# Patient Record
Sex: Male | Born: 1975
Health system: Southern US, Community
[De-identification: ages and names within clinical notes are randomized; demographics above are authoritative.]

## PROBLEM LIST (undated history)

## (undated) DIAGNOSIS — I10 Essential (primary) hypertension: Secondary | ICD-10-CM

## (undated) DIAGNOSIS — K219 Gastro-esophageal reflux disease without esophagitis: Secondary | ICD-10-CM

## (undated) DIAGNOSIS — F410 Panic disorder [episodic paroxysmal anxiety] without agoraphobia: Secondary | ICD-10-CM

## (undated) DIAGNOSIS — J302 Other seasonal allergic rhinitis: Secondary | ICD-10-CM

## (undated) DIAGNOSIS — I480 Paroxysmal atrial fibrillation: Principal | ICD-10-CM

## (undated) HISTORY — DX: Other seasonal allergic rhinitis: J30.2

## (undated) HISTORY — PX: NO PAST SURGERIES: SHX2092

## (undated) HISTORY — DX: Essential (primary) hypertension: I10

## (undated) HISTORY — DX: Gastro-esophageal reflux disease without esophagitis: K21.9

## (undated) HISTORY — DX: Paroxysmal atrial fibrillation: I48.0

---

## 2016-01-02 ENCOUNTER — Encounter (HOSPITAL_COMMUNITY): Payer: Self-pay

## 2016-01-02 ENCOUNTER — Emergency Department (HOSPITAL_COMMUNITY)
Admission: EM | Admit: 2016-01-02 | Discharge: 2016-01-02 | Disposition: A | Discharge status: Home / Self Care | Payer: 59 | Attending: Emergency Medicine | Admitting: Emergency Medicine

## 2016-01-02 DIAGNOSIS — F1290 Cannabis use, unspecified, uncomplicated: Secondary | ICD-10-CM | POA: Insufficient documentation

## 2016-01-02 DIAGNOSIS — R002 Palpitations: Secondary | ICD-10-CM | POA: Diagnosis present

## 2016-01-02 DIAGNOSIS — F17200 Nicotine dependence, unspecified, uncomplicated: Secondary | ICD-10-CM | POA: Insufficient documentation

## 2016-01-02 DIAGNOSIS — I480 Paroxysmal atrial fibrillation: Secondary | ICD-10-CM | POA: Diagnosis not present

## 2016-01-02 HISTORY — DX: Panic disorder (episodic paroxysmal anxiety): F41.0

## 2016-01-02 LAB — BASIC METABOLIC PANEL
Anion gap: 5 (ref 5–15)
BUN: 15 mg/dL (ref 6–20)
CALCIUM: 9.3 mg/dL (ref 8.9–10.3)
CO2: 22 mmol/L (ref 22–32)
CREATININE: 0.84 mg/dL (ref 0.61–1.24)
Chloride: 114 mmol/L — ABNORMAL HIGH (ref 101–111)
Glucose, Bld: 108 mg/dL — ABNORMAL HIGH (ref 65–99)
Potassium: 3.9 mmol/L (ref 3.5–5.1)
SODIUM: 141 mmol/L (ref 135–145)

## 2016-01-02 LAB — CBC
HCT: 46.3 % (ref 39.0–52.0)
HEMOGLOBIN: 15.3 g/dL (ref 13.0–17.0)
MCH: 28.3 pg (ref 26.0–34.0)
MCHC: 33 g/dL (ref 30.0–36.0)
MCV: 85.7 fL (ref 78.0–100.0)
PLATELETS: 194 10*3/uL (ref 150–400)
RBC: 5.4 MIL/uL (ref 4.22–5.81)
RDW: 13.2 % (ref 11.5–15.5)
WBC: 7.3 10*3/uL (ref 4.0–10.5)

## 2016-01-02 LAB — TROPONIN I

## 2016-01-02 LAB — TSH: TSH: 0.772 u[IU]/mL (ref 0.350–4.500)

## 2016-01-02 MED ORDER — DILTIAZEM HCL 25 MG/5ML IV SOLN
10.0000 mg | Freq: Once | INTRAVENOUS | Status: AC
  Administered 2016-01-02: 10 mg via INTRAVENOUS
  Filled 2016-01-02: qty 5

## 2016-01-02 MED ORDER — FLECAINIDE ACETATE 100 MG PO TABS
300.0000 mg | ORAL_TABLET | Freq: Once | ORAL | Status: AC
  Administered 2016-01-02: 300 mg via ORAL
  Filled 2016-01-02: qty 3

## 2016-01-02 MED ORDER — ASPIRIN EC 81 MG PO TBEC
81.0000 mg | DELAYED_RELEASE_TABLET | Freq: Every day | ORAL | Status: DC
Start: 2016-01-02 — End: 2016-01-05

## 2016-01-02 MED ORDER — METOPROLOL SUCCINATE ER 25 MG PO TB24
25.0000 mg | ORAL_TABLET | Freq: Every day | ORAL | Status: DC
Start: 2016-01-02 — End: 2016-01-05

## 2016-01-02 MED ORDER — SODIUM CHLORIDE 0.9 % IV SOLN
1000.0000 mL | Freq: Once | INTRAVENOUS | Status: AC
  Administered 2016-01-02: 1000 mL via INTRAVENOUS

## 2016-01-02 MED ORDER — LORAZEPAM 2 MG/ML IJ SOLN
1.0000 mg | Freq: Once | INTRAMUSCULAR | Status: AC
  Administered 2016-01-02: 1 mg via INTRAVENOUS
  Filled 2016-01-02: qty 1

## 2016-01-02 MED ORDER — SODIUM CHLORIDE 0.9 % IV SOLN
1000.0000 mL | INTRAVENOUS | Status: DC
  Administered 2016-01-02: 1000 mL via INTRAVENOUS

## 2016-01-02 MED ORDER — DILTIAZEM HCL 25 MG/5ML IV SOLN
20.0000 mg | Freq: Once | INTRAVENOUS | Status: AC
  Administered 2016-01-02: 20 mg via INTRAVENOUS
  Filled 2016-01-02: qty 5

## 2016-01-02 NOTE — ED Notes (Signed)
Pt given discharge instructions verbalized understanding of need to follow up, reasons to return to the ED and medications to take at home. VSS. IV removed intact, site clean and dry. Pt denied further questions or pain. Pt denied pain. Pt able to change clothes and ambulate to exit without difficulty.

## 2016-01-02 NOTE — Discharge Instructions (Signed)

## 2016-01-02 NOTE — ED Notes (Signed)
He is more calm.  Monitor shows persistent a fib. With average rate at this time of 70/min.

## 2016-01-02 NOTE — ED Notes (Signed)
As I write this he has just taken his p.o. Med and is quite comfortably resting in the presence of his wife.  He remains in controlled a-fib. With average heart rate of 80.

## 2016-01-02 NOTE — ED Notes (Signed)
Patient reports that he has been having tachycardia and states his wife felt his pulse and it felt irregular. Patient states he does have panic attacks, but this feels a little different.

## 2016-01-02 NOTE — ED Notes (Signed)
Wife phone [phone]

## 2016-01-02 NOTE — ED Notes (Signed)
He is sleeping soundly and is breathing normally. I see that he converted to nsr at 1508 hours. I print a strip and give it to Dr. Venora Maples.

## 2016-01-02 NOTE — ED Provider Notes (Signed)
CSN: [id number]     Arrival date & time 01/02/16  [id number] History   First MD Initiated Contact with Patient 01/02/16 0930     Chief Complaint  Patient presents with  . Tachycardia      HPI Patient presents to emergency department with complaints of palpitations which began this morning at approximately 3 AM.  He has a history of intermittent palpitations but they've usually always resolved on their own.  His been present for the past several years.  No known history of atrial fibrillation.  His wife works in the Physicist, medical and states that she felt his pulse and it felt irregular and thus he presented to the ER with concerns for possible A. fib.  He denies chest pain or shortness of breath.  No recent fevers or chills.  No illness.  Denies abdominal pain.  Reports an abnormal fluttering sensation occasionally in his chest since 3 AM.  When he went to bed last night he felt normal.   Past Medical History  Diagnosis Date  . Panic attacks    History reviewed. No pertinent past surgical history. Family History  Problem Relation Age of Onset  . Crohn's disease Mother   . Hypertension Mother    Social History  Substance Use Topics  . Smoking status: Never Smoker   . Smokeless tobacco: Current User  . Alcohol Use: Yes     Comment: 2-3 beers daily    Review of Systems  All other systems reviewed and are negative.     Allergies  Review of patient's allergies indicates no known allergies.  Home Medications   Prior to Admission medications   Not on File   BP 172/97 mmHg  Pulse 119  Temp(Src) 98.1 F (36.7 C) (Oral)  Resp 18  Ht 6\' 1"  (1.854 m)  Wt 208 lb (94.348 kg)  BMI 27.45 kg/m2  SpO2 100% Physical Exam  Constitutional: He is oriented to person, place, and time. He appears well-developed and well-nourished.  HENT:  Head: Normocephalic and atraumatic.  Eyes: EOM are normal.  Neck: Normal range of motion.  Cardiovascular: Intact distal pulses.   Irregularly  irregular and tachycardic rate  Pulmonary/Chest: Effort normal and breath sounds normal. No respiratory distress.  Abdominal: Soft. He exhibits no distension. There is no tenderness.  Musculoskeletal: Normal range of motion.  Neurological: He is alert and oriented to person, place, and time.  Skin: Skin is warm and dry.  Psychiatric: He has a normal mood and affect. Judgment normal.  Nursing note and vitals reviewed.   ED Course  Procedures (including critical care time) Labs Review Labs Reviewed  BASIC METABOLIC PANEL - Abnormal; Notable for the following:    Chloride 114 (*)    Glucose, Bld 108 (*)    All other components within normal limits  CBC  TROPONIN I  TSH    Imaging Review No results found. I have personally reviewed and evaluated these images and lab results as part of my medical decision-making.   EKG Interpretation #1 Date/Time:  Saturday January 02 2016 09:25:18 EDT Ventricular Rate:  105 PR Interval:    QRS Duration: 92 QT Interval:  319 QTC Calculation: 406 R Axis:   102 Text Interpretation:  Atrial fibrillation Right axis deviation Borderline  low voltage, extremity leads No old tracing to compare Confirmed by Laconda Basich   MD, Luismario Coston ([id number]) on 01/02/2016 4:19:10 PM    ECG interpretation #2  Date: 01/02/2016 (15:38)  Rate: 72  Rhythm: normal sinus rhythm  QRS Axis: normal  Intervals: normal  ST/T Wave abnormalities: normal  Conduction Disutrbances: none  Narrative Interpretation:   Old EKG Reviewed: no longer in afib       MDM   Final diagnoses:  Paroxysmal atrial fibrillation ([place])    Presented with onset of A. fib.  This likely began 3 AM.  Per history it sounds as though this is been intermittent issue of the past several years.  No prior history of A. fib.  Patient did not cover with IV Cardizem and therefore was given flecainide.  Patient converted at 3:06 PM.   Patient be placed on 81 mg aspirin as well as 25 mg of Toprol XL.  Cardiology  follow-up in the ambulatory A. fib clinic.  CHADS-VASc = 0    Jola Schmidt, MD 01/02/16 [phone]

## 2016-01-05 ENCOUNTER — Ambulatory Visit (HOSPITAL_COMMUNITY)
Admission: RE | Admit: 2016-01-05 | Discharge: 2016-01-05 | Disposition: A | Discharge status: Home / Self Care | Payer: 59 | Source: Ambulatory Visit | Attending: Nurse Practitioner | Admitting: Nurse Practitioner

## 2016-01-05 ENCOUNTER — Encounter (HOSPITAL_COMMUNITY): Payer: Self-pay | Admitting: Nurse Practitioner

## 2016-01-05 VITALS — BP 140/98 | HR 75

## 2016-01-05 DIAGNOSIS — F17220 Nicotine dependence, chewing tobacco, uncomplicated: Secondary | ICD-10-CM | POA: Diagnosis not present

## 2016-01-05 DIAGNOSIS — Z7982 Long term (current) use of aspirin: Secondary | ICD-10-CM | POA: Insufficient documentation

## 2016-01-05 DIAGNOSIS — I480 Paroxysmal atrial fibrillation: Secondary | ICD-10-CM | POA: Insufficient documentation

## 2016-01-05 DIAGNOSIS — Z8249 Family history of ischemic heart disease and other diseases of the circulatory system: Secondary | ICD-10-CM | POA: Insufficient documentation

## 2016-01-05 DIAGNOSIS — I4891 Unspecified atrial fibrillation: Secondary | ICD-10-CM | POA: Diagnosis present

## 2016-01-05 MED ORDER — RIVAROXABAN 20 MG PO TABS: 20.0000 mg | ORAL_TABLET | Freq: Every day | ORAL | 0 refills | Status: DC

## 2016-01-05 MED ORDER — DILTIAZEM HCL 30 MG PO TABS: ORAL_TABLET | ORAL | 1 refills | Status: DC

## 2016-01-05 MED FILL — XARELTO 20 MG TABLET: 20 | 30 days supply | Qty: 30 | Fill #0

## 2016-01-05 MED FILL — dilTIAZem HCL 30 MG TABS: 30 | 7 days supply | Qty: 45 | Fill #0

## 2016-01-05 NOTE — Patient Instructions (Signed)
Your physician has recommended you make the following change in your medication:  1)Xarelto 20mg  -- take 1 tablet by mouth with supper - take for 30 days 2)Cardizem 30mg  -- take 1 tablet every 4 hours AS NEEDED for heart rate >100 as long as blood pressure >100.  3)Stop Aspirin 4)Stop Metoprolol

## 2016-01-05 NOTE — Progress Notes (Signed)
Patient ID: Dean Hughes, male   DOB: 02/25/1976, 40 y.o.   MRN: [id number]     Primary Care Physician: No primary care provider on file. Referring Physician: ER f/u   Dean Hughes is a 40 y.o. male with  visit to ER with palpitations that started at 3am after he awoke suddenly to the sound of his young daughter falling out of bed and found to be in new onset afib. He was given IV Cardizem, which did not convert pt and he was then given flecainide which did convert pt. He was given RX for asa and Toprol  xl 25 mg  daily. He reports that he has had several small episodes of irregular heart beat over the years but nothing as severe compared to the episode that brought him to the ER.  He does drink 2-3 beers a night and more on the weekend. Per wife, who is an Therapist, sports in the neuro unit at [employer], he does not snore. He has stopped all alcohol and has cut out his caffeine. He also uses smokeless tobacco. 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 He is not obese. He feels that he does not feel as well taking daily BB, feels sluggish.  Today, he denies symptoms of palpitations, chest pain, shortness of breath, orthopnea, PND, lower extremity edema, dizziness, presyncope, syncope, or neurologic sequela. The patient is tolerating medications without difficulties and is otherwise without complaint today.   Past Medical History:  Diagnosis Date  . Panic attacks    No past surgical history on file.  Current Outpatient Prescriptions  Medication Sig Dispense Refill  . diltiazem (CARDIZEM) 30 MG tablet Cardizem 30mg  -- take 1 tablet every 4 hours AS NEEDED for heart rate >100 as long as blood pressure >100. 45 tablet 1  . rivaroxaban (XARELTO) 20 MG TABS tablet Take 1 tablet (20 mg total) by mouth daily with supper. 30 tablet 0   No current facility-administered medications for this encounter.     No Known Allergies  Social  History   Social History  . Marital status: Married    Spouse name: N/A  . Number of children: N/A  . Years of education: N/A   Occupational History  . Not on file.   Social History Main Topics  . Smoking status: Never Smoker  . Smokeless tobacco: Current User  . Alcohol use Yes     Comment: 2-3 beers daily  . Drug use:     Types: Marijuana     Comment: 2-3 times a week  . Sexual activity: Yes   Other Topics Concern  . Not on file   Social History Narrative  . No narrative on file    Family History  Problem Relation Age of Onset  . Crohn's disease Mother   . Hypertension Mother     ROS- All systems are reviewed and negative except as per the HPI above  Physical Exam: Vitals:   01/05/16 1413  BP: (!) 140/98  Pulse: 75    GEN- The patient is well appearing, alert and oriented x 3 today.   Head- normocephalic, atraumatic Eyes-  Sclera clear, conjunctiva pink Ears- hearing intact Oropharynx- clear Neck- supple, no JVP Lymph- no cervical lymphadenopathy Lungs- Clear to ausculation bilaterally, normal work of breathing Heart- Regular rate and rhythm, no murmurs, rubs or gallops, PMI not laterally displaced GI- soft, NT, ND, + BS Extremities- no clubbing,  cyanosis, or edema MS- no significant deformity or atrophy Skin- no rash or lesion Psych- euthymic mood, full affect Neuro- strength and sensation are intact  EKG-NSR at 75 bpm, pr int 172 ms, qrs int 82 ms, QTC 419 ms Epic records reviewed  Assessment and Plan: 1. PAF Now in SR but does not feel well on daily BB, will stop daily BB RX for cardizem 30 mg as need for PAF Per cardioversion protocol, regardless of chadsvasc score, and reviewed with Dr. Rayann Heman, any pt who is converted either electrically or chemically, should be on anticoagulant x 30 days. Pt does not have bleeding issues Will start xarelto 20 mg x 30 days Stop asa Bleeding precautions discussed Echo scheduled  2. Lifestyle risk  factors Stop smokeless tobacco Pt asked to reduce alcohol intake to no more than 2 a week Can have some caffeine, but watch overall intake and avoid other stimulants  F/u with Dr. Radford Pax as scheduled 8/8   Butch Penny C. Rosanne Wohlfarth, [facility] [address] [place], [place] [address] [phone]

## 2016-01-11 ENCOUNTER — Ambulatory Visit (HOSPITAL_COMMUNITY)
Admission: RE | Admit: 2016-01-11 | Discharge: 2016-01-11 | Disposition: A | Discharge status: Home / Self Care | Payer: 59 | Source: Ambulatory Visit | Attending: Nurse Practitioner | Admitting: Nurse Practitioner

## 2016-01-11 DIAGNOSIS — I480 Paroxysmal atrial fibrillation: Secondary | ICD-10-CM

## 2016-01-11 DIAGNOSIS — I4891 Unspecified atrial fibrillation: Secondary | ICD-10-CM | POA: Diagnosis present

## 2016-01-11 NOTE — Progress Notes (Signed)
  Echocardiogram 2D Echocardiogram has been performed.  Diamond Nickel 01/11/2016, 9:55 AM

## 2016-01-12 ENCOUNTER — Encounter (INDEPENDENT_AMBULATORY_CARE_PROVIDER_SITE_OTHER): Payer: Self-pay

## 2016-01-19 ENCOUNTER — Encounter: Payer: Self-pay | Admitting: Cardiology

## 2016-01-19 ENCOUNTER — Ambulatory Visit (INDEPENDENT_AMBULATORY_CARE_PROVIDER_SITE_OTHER): Payer: 59 | Admitting: Cardiology

## 2016-01-19 DIAGNOSIS — I480 Paroxysmal atrial fibrillation: Secondary | ICD-10-CM | POA: Diagnosis not present

## 2016-01-19 DIAGNOSIS — I4819 Other persistent atrial fibrillation: Secondary | ICD-10-CM | POA: Insufficient documentation

## 2016-01-19 HISTORY — DX: Paroxysmal atrial fibrillation: I48.0

## 2016-01-19 NOTE — Progress Notes (Signed)
Cardiology Office Note    Date:  01/19/2016   ID:  Dean Hughes, DOB 1975/10/24, MRN [id number]  PCP:  No primary care provider on file.  Cardiologist:  Fransico Him, MD   Chief Complaint  Patient presents with  . Atrial Fibrillation    History of Present Illness:  Dean Hughes is a 40 y.o. male with recent  visit to ER with palpitations that started at 3am after he awoke suddenly to the sound of his young daughter falling out of bed and found to be in new onset afib. He was given IV Cardizem, which did not convert pt and he was then given flecainide which did convert pt. He was given RX for asa and Toprol  xl 25 mg  daily. He reported that he has had several small episodes of irregular heart beat over the years but nothing as severe compared to the episode that brought him to the ER.  He does drink 2-3 beers a night and more on the weekend. . He has stopped all alcohol and has cut out his caffeine. He also uses smokeless tobacco. 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 He is not obese. He feels that he does not feel as well taking daily BB, feels sluggish.  He was seen by Roderic Palau NP and was in NSR.  His BB was stopped due to fatigue.  He was placed on Xarelto with plan to continue for 30 days since we do not know when he converted.  ASA was stopped.  2D echo was normal.  He is now here for evaluation.  He denies any further palpitations.  He denies any chest pain, SOB, DOE, LE edema, dizziness or syncope.     Past Medical History:  Diagnosis Date  . PAF (paroxysmal atrial fibrillation) ([place]) 01/19/2016  . Panic attacks     Past Surgical History:  Procedure Laterality Date  . NO PAST SURGERIES      Current Medications: Outpatient Medications Prior to Visit  Medication Sig Dispense Refill  . diltiazem (CARDIZEM) 30 MG tablet Cardizem 30mg  -- take 1 tablet every 4 hours AS NEEDED for  heart rate >100 as long as blood pressure >100. 45 tablet 1  . rivaroxaban (XARELTO) 20 MG TABS tablet Take 1 tablet (20 mg total) by mouth daily with supper. 30 tablet 0   No facility-administered medications prior to visit.      Allergies:   Review of patient's allergies indicates no known allergies.   Social History   Social History  . Marital status: Married    Spouse name: N/A  . Number of children: N/A  . Years of education: N/A   Social History Main Topics  . Smoking status: Never Smoker  . Smokeless tobacco: Current User  . Alcohol use Yes     Comment: 2-3 beers daily  . Drug use:     Types: Marijuana     Comment: 2-3 times a week  . Sexual activity: Yes   Other Topics Concern  . None   Social History Narrative  . None     Family History:  The patient's family history includes Crohn's disease in his mother; Hypertension in his mother.   ROS:   Please see the history of present illness.    ROS All other systems reviewed and are negative.   PHYSICAL EXAM:   VS:  BP (!) 144/80  Pulse 78   Ht 6\' 1"  (1.854 m)   Wt 204 lb 6.4 oz (92.7 kg)   SpO2 99%   BMI 26.97 kg/m    GEN: Well nourished, well developed, in no acute distress  HEENT: normal  Neck: no JVD, carotid bruits, or masses Cardiac: RRR; no murmurs, rubs, or gallops,no edema.  Intact distal pulses bilaterally.  Respiratory:  clear to auscultation bilaterally, normal work of breathing GI: soft, nontender, nondistended, + BS MS: no deformity or atrophy  Skin: warm and dry, no rash Neuro:  Alert and Oriented x 3, Strength and sensation are intact Psych: euthymic mood, full affect  Wt Readings from Last 3 Encounters:  01/19/16 204 lb 6.4 oz (92.7 kg)  01/02/16 208 lb (94.3 kg)      Studies/Labs Reviewed:   EKG:  EKG is not ordered today.    Recent Labs: 01/02/2016: BUN 15; Creatinine, Ser 0.84; Hemoglobin 15.3; Platelets 194; Potassium 3.9; Sodium 141; TSH 0.772   Lipid Panel No results  found for: CHOL, TRIG, HDL, CHOLHDL, VLDL, LDLCALC, LDLDIRECT  Additional studies/ records that were reviewed today include:  Hospital records.    ASSESSMENT:    1. PAF (paroxysmal atrial fibrillation) (HCC)      PLAN:  In order of problems listed above:  1. PAF - he has been maintaining NSR.  I have told him that he could stop the Xarelto on 02/05/2016.  His CHADS2VASC score is 0.  We discussed cutting back on the alcohol.  TSH is normal. 2d echo showed normal LVF.     Medication Adjustments/Labs and Tests Ordered: Current medicines are reviewed at length with the patient today.  Concerns regarding medicines are outlined above.  Medication changes, Labs and Tests ordered today are listed in the Patient Instructions below.  There are no Patient Instructions on file for this visit.   Signed, Fransico Him, MD  01/19/2016 2:50 PM    [facility] [place], [place], [place]  [address] Phone: [phone]; Fax: [phone]

## 2016-01-19 NOTE — Patient Instructions (Signed)
Medication Instructions:  1) STOP XARELTO 02/05/2016  Labwork: None  Testing/Procedures: None  Follow-Up: Your physician wants you to follow-up in: 6 months with Dr. Radford Pax. You will receive a reminder letter in the mail two months in advance. If you don't receive a letter, please call our office to schedule the follow-up appointment.   Any Other Special Instructions Will Be Listed Below (If Applicable).     If you need a refill on your cardiac medications before your next appointment, please call your pharmacy.

## 2016-09-08 ENCOUNTER — Ambulatory Visit: Payer: 59 | Admitting: Cardiology

## 2016-09-29 ENCOUNTER — Encounter: Payer: Self-pay | Admitting: Cardiology

## 2016-09-29 ENCOUNTER — Ambulatory Visit (INDEPENDENT_AMBULATORY_CARE_PROVIDER_SITE_OTHER): Payer: 59 | Admitting: Cardiology

## 2016-09-29 VITALS — BP 134/92 | HR 71 | Ht 73.0 in | Wt 215.8 lb

## 2016-09-29 DIAGNOSIS — I481 Persistent atrial fibrillation: Secondary | ICD-10-CM

## 2016-09-29 DIAGNOSIS — I4819 Other persistent atrial fibrillation: Secondary | ICD-10-CM

## 2016-09-29 NOTE — Progress Notes (Signed)
Cardiology Office Note    Date:  09/29/2016   ID:  Dean Hughes, DOB 10/19/1975, MRN [id number]  PCP:  No PCP Per Patient  Cardiologist:  Fransico Him, MD   Chief Complaint  Patient presents with  . Atrial Fibrillation    History of Present Illness:  Dean Hughes is a 41 y.o. male with a history of persistent afib.  He was on a BB which was stopped due to fatigue. 2D echo was normal.  He is now here for followup.  He denies any further palpitations or breakthrough of atrial fibrillation.  He denies any chest pain, SOB, DOE, LE edema, dizziness or syncope.      Past Medical History:  Diagnosis Date  . PAF (paroxysmal atrial fibrillation) ([place]) 01/19/2016  . Panic attacks     Past Surgical History:  Procedure Laterality Date  . NO PAST SURGERIES      Current Medications: Current Meds  Medication Sig  . diltiazem (CARDIZEM) 30 MG tablet Cardizem 30mg  -- take 1 tablet every 4 hours AS NEEDED for heart rate >100 as long as blood pressure >100.    Allergies:   Patient has no known allergies.   Social History   Social History  . Marital status: Married    Spouse name: N/A  . Number of children: N/A  . Years of education: N/A   Social History Main Topics  . Smoking status: Never Smoker  . Smokeless tobacco: Current User  . Alcohol use Yes     Comment: 2-3 beers daily  . Drug use: Yes    Types: Marijuana     Comment: 2-3 times a week  . Sexual activity: Yes   Other Topics Concern  . None   Social History Narrative  . None     Family History:  The patient's family history includes Crohn's disease in his mother; Hypertension in his mother.   ROS:   Please see the history of present illness.    ROS All other systems reviewed and are negative.  No flowsheet data found.     PHYSICAL EXAM:   VS:  BP (!) 134/92   Pulse 71   Ht 6\' 1"  (1.854 m)   Wt 215 lb 12.8 oz (97.9 kg)   SpO2 98%   BMI 28.47 kg/m    GEN: Well nourished, well developed,  in no acute distress  HEENT: normal  Neck: no JVD, carotid bruits, or masses Cardiac: RRR; no murmurs, rubs, or gallops,no edema.  Intact distal pulses bilaterally.  Respiratory:  clear to auscultation bilaterally, normal work of breathing GI: soft, nontender, nondistended, + BS MS: no deformity or atrophy  Skin: warm and dry, no rash Neuro:  Alert and Oriented x 3, Strength and sensation are intact Psych: euthymic mood, full affect  Wt Readings from Last 3 Encounters:  09/29/16 215 lb 12.8 oz (97.9 kg)  01/19/16 204 lb 6.4 oz (92.7 kg)  01/02/16 208 lb (94.3 kg)      Studies/Labs Reviewed:   EKG:  EKG is not ordered today.   Recent Labs: 01/02/2016: BUN 15; Creatinine, Ser 0.84; Hemoglobin 15.3; Platelets 194; Potassium 3.9; Sodium 141; TSH 0.772   Lipid Panel No results found for: CHOL, TRIG, HDL, CHOLHDL, VLDL, LDLCALC, LDLDIRECT  Additional studies/ records that were reviewed today include:  none    ASSESSMENT:    1. Persistent atrial fibrillation (HCC)      PLAN:  In order of problems listed above:  1. Persistent atrial fibrillation -  he is maintaining NSR.  He will continue on Cardizem PRN.  He was encouraged to limit his caffeine and ETOH use.  His CHADS2VASC score is 0 so he is not on anticoagulation.    Medication Adjustments/Labs and Tests Ordered: Current medicines are reviewed at length with the patient today.  Concerns regarding medicines are outlined above.  Medication changes, Labs and Tests ordered today are listed in the Patient Instructions below.  There are no Patient Instructions on file for this visit.   Signed, Fransico Him, MD  09/29/2016 7:53 AM    [facility], [place], [place]  [address] Phone: [phone]; Fax: [phone]

## 2016-09-29 NOTE — Patient Instructions (Signed)

## 2019-06-13 ENCOUNTER — Ambulatory Visit (INDEPENDENT_AMBULATORY_CARE_PROVIDER_SITE_OTHER): Payer: Self-pay

## 2019-06-13 ENCOUNTER — Ambulatory Visit (HOSPITAL_COMMUNITY)
Admission: EM | Admit: 2019-06-13 | Discharge: 2019-06-13 | Disposition: A | Discharge status: Home / Self Care | Payer: Self-pay | Attending: Family Medicine | Admitting: Family Medicine

## 2019-06-13 ENCOUNTER — Other Ambulatory Visit: Payer: Self-pay

## 2019-06-13 ENCOUNTER — Encounter (HOSPITAL_COMMUNITY): Payer: Self-pay

## 2019-06-13 DIAGNOSIS — M545 Low back pain, unspecified: Secondary | ICD-10-CM

## 2019-06-13 MED ORDER — CYCLOBENZAPRINE HCL 10 MG PO TABS: 10.0000 mg | ORAL_TABLET | Freq: Two times a day (BID) | ORAL | 0 refills | Status: DC | PRN

## 2019-06-13 MED ORDER — KETOROLAC TROMETHAMINE 30 MG/ML IJ SOLN
INTRAMUSCULAR | Status: AC
  Filled 2019-06-13: qty 1

## 2019-06-13 MED ORDER — KETOROLAC TROMETHAMINE 30 MG/ML IJ SOLN
30.0000 mg | Freq: Once | INTRAMUSCULAR | Status: AC
  Administered 2019-06-13: 30 mg via INTRAMUSCULAR

## 2019-06-13 MED ORDER — PREDNISONE 10 MG (21) PO TBPK: ORAL_TABLET | ORAL | 0 refills | Status: DC

## 2019-06-13 MED FILL — predniSONE 10 MG (21) TBPK: 10 | 6 days supply | Qty: 21 | Fill #0

## 2019-06-13 MED FILL — CYCLOBENZAPRINE HCL 10 MG T: 10 | 10 days supply | Qty: 20 | Fill #0

## 2019-06-13 NOTE — ED Triage Notes (Signed)
Pt presents with severe lower back pain and stiffness since Monday.

## 2019-06-13 NOTE — ED Provider Notes (Signed)
[facility]    CSN: [id number] Arrival date & time: 06/13/19  1006      History   Chief Complaint Chief Complaint  Patient presents with  . Appointment  . (10:00 am BACK PAIN)    HPI Dean Hughes is a 43 y.o. male.   Patient is a 43 year old male with a past medical history of proximal A. fib, panic attacks.  He presents today with approximate 4 days of worsening lower back discomfort.  The back pain is generalized across the entire lower back.  He has very limited range of motion.  Reporting very physical job with working at [employer].  He also has been teaching his daughter to play tennis.  He sees a Restaurant manager, fast food from time to time which helps some with his back pain.  He went to the chiropractor Tuesday which he reports initially had some relief but then the pain worsened throughout Wednesday and today.  He is having trouble getting in and out of bed and turning from side to side.  Describes the pain as sharp and stabbing at times.  Denies any associated numbness, tingling, weakness, saddle paresthesias or loss of bowel or bladder control.  Denies any fevers, dysuria, hematuria or urinary frequency.  ROS per HPI      Past Medical History:  Diagnosis Date  . PAF (paroxysmal atrial fibrillation) ([place]) 01/19/2016  . Panic attacks     Patient Active Problem List   Diagnosis Date Noted  . Persistent atrial fibrillation ([place]) 01/19/2016    Past Surgical History:  Procedure Laterality Date  . NO PAST SURGERIES         Home Medications    Prior to Admission medications   Medication Sig Start Date End Date Taking? Authorizing Provider  Aspirin Effervescent (ALKA-SELTZER PO) Take 1 tablet by mouth daily as needed (Cold/sinus).    [provider]  cyclobenzaprine (FLEXERIL) 10 MG tablet Take 1 tablet (10 mg total) by mouth 2 (two) times daily as needed for muscle spasms. 06/13/19   Loura Halt A, NP  diltiazem (CARDIZEM) 30 MG tablet  Cardizem 30mg  -- take 1 tablet every 4 hours AS NEEDED for heart rate >100 as long as blood pressure >100. 01/05/16   Sherran Needs, NP  ibuprofen (ADVIL,MOTRIN) 200 MG tablet Take 200 mg by mouth every 6 (six) hours as needed (pain).    [provider]  predniSONE (STERAPRED UNI-PAK 21 TAB) 10 MG (21) TBPK tablet 6 tabs for 1 day, then 5 tabs for 1 das, then 4 tabs for 1 day, then 3 tabs for 1 day, 2 tabs for 1 day, then 1 tab for 1 day 06/13/19   Orvan July, NP    Family History Family History  Problem Relation Age of Onset  . Crohn's disease Mother   . Hypertension Mother     Social History Social History   Tobacco Use  . Smoking status: Never Smoker  . Smokeless tobacco: Current User  Substance Use Topics  . Alcohol use: Yes    Comment: 2-3 beers daily  . Drug use: Yes    Types: Marijuana    Comment: 2-3 times a week     Allergies   Patient has no known allergies.   Review of Systems Review of Systems   Physical Exam Triage Vital Signs ED Triage Vitals  Enc Vitals Group     BP 06/13/19 1047 (!) 150/105     Pulse Rate 06/13/19 1047 77  Resp 06/13/19 1047 18     Temp 06/13/19 1047 98.2 F (36.8 C)     Temp Source 06/13/19 1047 Oral     SpO2 06/13/19 1047 100 %     Weight --      Height --      Head Circumference --      Peak Flow --      Pain Score 06/13/19 1046 10     Pain Loc --      Pain Edu? --      Excl. in [place]? --    No data found.  Updated Vital Signs BP (!) 150/105 (BP Location: Right Arm)   Pulse 77   Temp 98.2 F (36.8 C) (Oral)   Resp 18   SpO2 100%   Visual Acuity Right Eye Distance:   Left Eye Distance:   Bilateral Distance:    Right Eye Near:   Left Eye Near:    Bilateral Near:     Physical Exam Vitals and nursing note reviewed.  Constitutional:      Appearance: Normal appearance.  HENT:     Head: Normocephalic and atraumatic.     Nose: Nose normal.  Eyes:     Conjunctiva/sclera: Conjunctivae normal.    Pulmonary:     Effort: Pulmonary effort is normal.  Abdominal:     Palpations: Abdomen is soft.     Tenderness: There is no abdominal tenderness.  Musculoskeletal:        General: Normal range of motion.     Cervical back: Normal range of motion.     Lumbar back: Spasms and tenderness present. No swelling, deformity or lacerations.       Back:  Skin:    General: Skin is warm and dry.  Neurological:     Mental Status: He is alert.  Psychiatric:        Mood and Affect: Mood normal.      UC Treatments / Results  Labs (all labs ordered are listed, but only abnormal results are displayed) Labs Reviewed - No data to display  EKG   Radiology DG Lumbar Spine Complete  Result Date: 06/13/2019 CLINICAL DATA:  Low back pain for several days following heavy lifting, initial encounter EXAM: LUMBAR SPINE - COMPLETE 4+ VIEW COMPARISON:  None. FINDINGS: Five lumbar type vertebral bodies are visualized. Mild osteophytic changes are seen. No pars defects are noted. No compression deformity is noted. Mild disc space narrowing at L4-5 is noted. No soft tissue abnormality is seen. IMPRESSION: Mild degenerative change without acute abnormality. Electronically Signed   By: Inez Catalina M.D.   On: 06/13/2019 11:38    Procedures Procedures (including critical care time)  Medications Ordered in UC Medications  ketorolac (TORADOL) 30 MG/ML injection 30 mg (has no administration in time range)    Initial Impression / Assessment and Plan / UC Course  I have reviewed the triage vital signs and the nursing notes.  Pertinent labs & imaging results that were available during my care of the patient were reviewed by me and considered in my medical decision making (see chart for details).     Lower back pain-x-ray with mild degenerative changes to L4-L5 Most likely a nerve inflammation and muscle spasm causing his symptoms. No red flags We will treat him here with Toradol injection and sent home  with prednisone taper and muscle relaxant to use Recommended heat/ice and to please follow-up for any continued or worsening problems. Final Clinical Impressions(s) / UC Diagnoses  Final diagnoses:  Bilateral low back pain without sciatica, unspecified chronicity     Discharge Instructions     X-ray showed some mild degenerative disc disease but nothing acute or concerning. Most likely some nerve inflammation with muscle spasming. We will treat with Toradol injection here in clinic and sent home with prednisone taper and muscle relaxant. Rest, heat and follow-up if symptoms worsen    ED Prescriptions    Medication Sig Dispense Auth. Provider   predniSONE (STERAPRED UNI-PAK 21 TAB) 10 MG (21) TBPK tablet 6 tabs for 1 day, then 5 tabs for 1 das, then 4 tabs for 1 day, then 3 tabs for 1 day, 2 tabs for 1 day, then 1 tab for 1 day 21 tablet Gardiner E[place]a A, NP   cyclobenzaprine (FLEXERIL) 10 MG tablet Take 1 tablet (10 mg total) by mouth 2 (two) times daily as needed for muscle spasms. 20 tablet Loura Halt A, NP     PDMP not reviewed this encounter.   Orvan July, NP 06/13/19 1205

## 2019-06-13 NOTE — Discharge Instructions (Addendum)
X-ray showed some mild degenerative disc disease but nothing acute or concerning. Most likely some nerve inflammation with muscle spasming. We will treat with Toradol injection here in clinic and sent home with prednisone taper and muscle relaxant. Rest, heat and follow-up if symptoms worsen

## 2019-07-15 ENCOUNTER — Other Ambulatory Visit: Payer: Self-pay

## 2019-07-15 ENCOUNTER — Encounter: Payer: Self-pay | Admitting: Medical

## 2019-07-15 ENCOUNTER — Ambulatory Visit (INDEPENDENT_AMBULATORY_CARE_PROVIDER_SITE_OTHER): Payer: No Typology Code available for payment source | Admitting: Medical

## 2019-07-15 VITALS — BP 160/98 | HR 75 | Temp 98.1°F | Resp 18 | Ht 73.0 in | Wt 227.8 lb

## 2019-07-15 DIAGNOSIS — I10 Essential (primary) hypertension: Secondary | ICD-10-CM | POA: Diagnosis not present

## 2019-07-15 DIAGNOSIS — Z8679 Personal history of other diseases of the circulatory system: Secondary | ICD-10-CM

## 2019-07-15 LAB — LIPID PANEL
Cholesterol: 196 mg/dL (ref 0–200)
HDL: 35 mg/dL — ABNORMAL LOW (ref >39.00)
LDL Cholesterol: 144 mg/dL — ABNORMAL HIGH (ref 0–99)
NonHDL: 160.6
Total CHOL/HDL Ratio: 6
Triglycerides: 85 mg/dL (ref 0.0–149.0)
VLDL: 17 mg/dL (ref 0.0–40.0)

## 2019-07-15 MED ORDER — METOPROLOL SUCCINATE ER 25 MG PO TB24
25.0000 mg | ORAL_TABLET | Freq: Every day | ORAL | 0 refills | Status: DC
Start: 2019-07-15 — End: 2019-07-15

## 2019-07-15 MED ORDER — HYDROCHLOROTHIAZIDE 12.5 MG PO CAPS: 12.5000 mg | ORAL_CAPSULE | Freq: Every day | ORAL | 0 refills | Status: DC

## 2019-07-15 MED ORDER — METOPROLOL SUCCINATE ER 25 MG PO TB24: 25.0000 mg | ORAL_TABLET | Freq: Every day | ORAL | 0 refills | Status: DC

## 2019-07-15 MED ORDER — METOPROLOL SUCCINATE ER 25 MG PO TB24: 25.0000 mg | ORAL_TABLET | Freq: Every day | ORAL | 3 refills | Status: DC

## 2019-07-15 MED FILL — HYDROCHLOROTHIAZIDE 12.5 MG: 12.5 | 30 days supply | Qty: 30 | Fill #0

## 2019-07-15 MED FILL — METOPROLOL SUCCINATE ER 25: 25 | 30 days supply | Qty: 30 | Fill #0

## 2019-07-15 NOTE — Patient Instructions (Addendum)
Your bp is high today. Based on your history of paroxysmal atrial fibrillation do think giving low dose b blocker in combination low dose hctz.  ekg today shows normal sinus rhythm. No atrial fibrillation seen on ekg.  Do want your wife to check bp readings over next 10 days.  Will get cmp and lipid panel today.   We got ekg today since none done in about 2-3 years.  Glad your back pain is resolved.  Follow up 10-14 days virtual visit or if needed. If any fast heart rate or palpitation then in office visit.

## 2019-07-15 NOTE — Progress Notes (Signed)
Subjective:    Patient ID: Dean Hughes, male    DOB: 1975/09/12, 44 y.o.   MRN: [id number]  HPI  Pt in for first time with myself.   Pt works for [employer], exercises some but not much compared to past. Married and has one child.  Pt states no recent pcp.   In 2017 he had event of atrial fibrillation. When he was first diagnosed he was given cardizem to use as needed. He states he had stressful event with his 13 year old daughter falling out of bed and waking him when his atrial fibrillation started.  Pt was given cardizem to take on prn basis. No anticoagulation meds given in 2018 visit. States never used cardizem.   Pt states he has no obvious severe palpitations. No obvious atrial fibrillation.    Just recently he states his bp has been high. When wife checks his bp has been systolic bp has been in the 160-140 range.  He is not sure on diastolic readings.  Pt thinks his pulse have been in 80-90 range when he checks his bp.   Pt lower back and buttox pain for which he was seen in ED 90% resolved or better. He went to chiropracter afterwards and states pain basically gone.     Review of Systems  Constitutional: Negative for chills, fatigue and fever.  Respiratory: Negative for cough, chest tightness, shortness of breath and wheezing.   Cardiovascular: Negative for chest pain and palpitations.  Gastrointestinal: Negative for abdominal pain.  Musculoskeletal: Negative for back pain.  Skin: Negative for rash.  Neurological: Negative for dizziness, speech difficulty, weakness, numbness and headaches.  Hematological: Negative for adenopathy. Does not bruise/bleed easily.  Psychiatric/Behavioral: Negative for behavioral problems, confusion, dysphoric mood and suicidal ideas. The patient is not nervous/anxious.     Past Medical History:  Diagnosis Date  . PAF (paroxysmal atrial fibrillation) ([place]) 01/19/2016  . Panic attacks      Social History   Socioeconomic  History  . Marital status: Married    Spouse name: Not on file  . Number of children: Not on file  . Years of education: Not on file  . Highest education level: Not on file  Occupational History  . Occupation: works Haematologist  Tobacco Use  . Smoking status: Never Smoker  . Smokeless tobacco: Former Systems developer    Types: Chew  Substance and Sexual Activity  . Alcohol use: Yes    Alcohol/week: 2.0 standard drinks    Types: 2 Cans of beer per week    Comment: occasionally 1-2 drinks at night after work  . Drug use: Yes    Types: Marijuana    Comment: 2-3 times a week occasionally(he states actually very rare)  . Sexual activity: Yes  Other Topics Concern  . Not on file  Social History Narrative  . Not on file   Social Determinants of Health   Financial Resource Strain:   . Difficulty of Paying Living Expenses: Not on file  Food Insecurity:   . Worried About Charity fundraiser in the Last Year: Not on file  . Ran Out of Food in the Last Year: Not on file  Transportation Needs:   . Lack of Transportation (Medical): Not on file  . Lack of Transportation (Non-Medical): Not on file  Physical Activity:   . Days of Exercise per Week: Not on file  . Minutes of Exercise per Session: Not on file  Stress:   . Feeling of Stress :  Not on file  Social Connections:   . Frequency of Communication with Friends and Family: Not on file  . Frequency of Social Gatherings with Friends and Family: Not on file  . Attends Religious Services: Not on file  . Active Member of Clubs or Organizations: Not on file  . Attends Archivist Meetings: Not on file  . Marital Status: Not on file  Intimate Partner Violence:   . Fear of Current or Ex-Partner: Not on file  . Emotionally Abused: Not on file  . Physically Abused: Not on file  . Sexually Abused: Not on file    Past Surgical History:  Procedure Laterality Date  . NO PAST SURGERIES      Family History  Problem Relation Age of  Onset  . Crohn's disease Mother   . Hypertension Mother     No Known Allergies  Current Outpatient Medications on File Prior to Visit  Medication Sig Dispense Refill  . Aspirin Effervescent (ALKA-SELTZER PO) Take 1 tablet by mouth daily as needed (Cold/sinus).    . cyclobenzaprine (FLEXERIL) 10 MG tablet Take 1 tablet (10 mg total) by mouth 2 (two) times daily as needed for muscle spasms. 20 tablet 0  . diltiazem (CARDIZEM) 30 MG tablet Cardizem 30mg  -- take 1 tablet every 4 hours AS NEEDED for heart rate >100 as long as blood pressure >100. 45 tablet 1  . ibuprofen (ADVIL,MOTRIN) 200 MG tablet Take 200 mg by mouth every 6 (six) hours as needed (pain).     No current facility-administered medications on file prior to visit.    BP (!) 160/98   Pulse 75   Temp 98.1 F (36.7 C) (Temporal)   Resp 18   Ht 6\' 1"  (1.854 m)   Wt 227 lb 12.8 oz (103.3 kg)   BMI 30.05 kg/m       Objective:   Physical Exam   General Mental Status- Alert. General Appearance- Not in acute distress.   Skin General: Color- Normal Color. Moisture- Normal Moisture.  Neck Carotid Arteries- Normal color. Moisture- Normal Moisture. No carotid bruits. No JVD.  Chest and Lung Exam Auscultation: Breath Sounds:-Normal.  Cardiovascular Auscultation:Rythm- Regular. Murmurs & Other Heart Sounds:Auscultation of the heart reveals- No Murmurs.  Abdomen Inspection:-Inspeection Normal. Palpation/Percussion:Note:No mass. Palpation and Percussion of the abdomen reveal- Non Tender, Non Distended + BS, no rebound or guarding.    Neurologic Cranial Nerve exam:- CN III-XII intact(No nystagmus), symmetric smile. Strength:- 5/5 equal and symmetric strength both upper and lower extremities.     Assessment & Plan:  Your bp is high today. Based on your history of paroxysmal atrial fibrillation do think giving low dose b blocker in combination low dose hctz.  Do want your wife to check bp readings over next 10  days.  Will get cmp.  We got ekg today since none done in about 2-3 years.  Glad your back pain is resolved.  Follow up 10-14 days virtual visit or if needed. If any fast heart rate or palpitation then in office visit.    Mackie Pai, PA-C

## 2019-07-18 ENCOUNTER — Other Ambulatory Visit (INDEPENDENT_AMBULATORY_CARE_PROVIDER_SITE_OTHER): Payer: No Typology Code available for payment source

## 2019-07-18 DIAGNOSIS — I10 Essential (primary) hypertension: Secondary | ICD-10-CM

## 2019-07-18 LAB — COMPREHENSIVE METABOLIC PANEL
ALT: 35 U/L (ref 0–53)
AST: 26 U/L (ref 0–37)
Albumin: 4.5 g/dL (ref 3.5–5.2)
Alkaline Phosphatase: 76 U/L (ref 39–117)
BUN: 13 mg/dL (ref 6–23)
CO2: 26 mEq/L (ref 19–32)
Calcium: 9.6 mg/dL (ref 8.4–10.5)
Chloride: 102 mEq/L (ref 96–112)
Creatinine, Ser: 1.07 mg/dL (ref 0.40–1.50)
GFR: 75.11 mL/min (ref >60.00)
Glucose, Bld: 92 mg/dL (ref 70–99)
Potassium: 4.3 mEq/L (ref 3.5–5.1)
Sodium: 139 mEq/L (ref 135–145)
Total Bilirubin: 0.5 mg/dL (ref 0.2–1.2)
Total Protein: 7.2 g/dL (ref 6.0–8.3)

## 2019-07-22 ENCOUNTER — Telehealth: Payer: Self-pay | Admitting: Medical

## 2019-07-22 ENCOUNTER — Encounter: Payer: Self-pay | Admitting: Medical

## 2019-07-22 MED ORDER — METOPROLOL SUCCINATE ER 50 MG PO TB24: 50.0000 mg | ORAL_TABLET | Freq: Every day | ORAL | 0 refills | Status: DC

## 2019-07-22 MED FILL — METOPROLOL SUCCINATE ER 50: 50 | 30 days supply | Qty: 30 | Fill #0

## 2019-07-22 NOTE — Telephone Encounter (Signed)
Rx higher dose metoprolol sent to pt pharmacy.

## 2019-07-24 ENCOUNTER — Other Ambulatory Visit: Payer: Self-pay

## 2019-07-25 ENCOUNTER — Ambulatory Visit (INDEPENDENT_AMBULATORY_CARE_PROVIDER_SITE_OTHER): Payer: No Typology Code available for payment source | Admitting: Medical

## 2019-07-25 ENCOUNTER — Other Ambulatory Visit: Payer: Self-pay

## 2019-07-25 ENCOUNTER — Encounter: Payer: Self-pay | Admitting: Medical

## 2019-07-25 VITALS — BP 139/97 | HR 68 | Temp 97.3°F | Resp 12 | Ht 73.0 in | Wt 225.0 lb

## 2019-07-25 DIAGNOSIS — R42 Dizziness and giddiness: Secondary | ICD-10-CM | POA: Diagnosis not present

## 2019-07-25 DIAGNOSIS — I10 Essential (primary) hypertension: Secondary | ICD-10-CM

## 2019-07-25 NOTE — Progress Notes (Signed)
Subjective:    Patient ID: Dean Hughes, male    DOB: [birth year], 44 y.o.   MRN: [id number]  HPI  Pt bp has been elevated. Pt sent me some my chart messages. Pt stopped hctz on Sunday. I increased the metoprolol to 50 mg daily. He started higher dose on Tuesday.   Pt states dizziness has decreased some since making changes.   No cardiac or neurologic signs and symptoms. He only has slight dizziness as he explains.  He had massage yesterday and he felt better overall.   Pt last week pt bp was 150/100, 165/100, 160/105, 150/100, 130/90.     Review of Systems  Constitutional: Negative for chills, fatigue and fever.  Respiratory: Negative for cough, chest tightness, shortness of breath and wheezing.   Cardiovascular: Negative for chest pain.  Gastrointestinal: Negative for abdominal pain, constipation, nausea and vomiting.  Genitourinary: Negative for dysuria.  Musculoskeletal: Negative for back pain.  Skin: Negative for rash.  Neurological: Positive for dizziness. Negative for seizures, speech difficulty, weakness and light-headedness.       Minimal at this point.  Hematological: Negative for adenopathy. Does not bruise/bleed easily.  Psychiatric/Behavioral: Negative for behavioral problems, decreased concentration, hallucinations, sleep disturbance and suicidal ideas. The patient is not nervous/anxious.     Past Medical History:  Diagnosis Date  . PAF (paroxysmal atrial fibrillation) ([place]) 01/19/2016  . Panic attacks      Social History   Socioeconomic History  . Marital status: Married    Spouse name: Not on file  . Number of children: Not on file  . Years of education: Not on file  . Highest education level: Not on file  Occupational History  . Occupation: works Haematologist  Tobacco Use  . Smoking status: Never Smoker  . Smokeless tobacco: Former Systems developer    Types: Chew  Substance and Sexual Activity  . Alcohol use: Yes    Alcohol/week: 2.0 standard drinks     Types: 2 Cans of beer per week    Comment: occasionally 1-2 drinks at night after work  . Drug use: Yes    Types: Marijuana    Comment: 2-3 times a week occasionally(he states actually very rare)  . Sexual activity: Yes  Other Topics Concern  . Not on file  Social History Narrative  . Not on file   Social Determinants of Health   Financial Resource Strain:   . Difficulty of Paying Living Expenses: Not on file  Food Insecurity:   . Worried About Charity fundraiser in the Last Year: Not on file  . Ran Out of Food in the Last Year: Not on file  Transportation Needs:   . Lack of Transportation (Medical): Not on file  . Lack of Transportation (Non-Medical): Not on file  Physical Activity:   . Days of Exercise per Week: Not on file  . Minutes of Exercise per Session: Not on file  Stress:   . Feeling of Stress : Not on file  Social Connections:   . Frequency of Communication with Friends and Family: Not on file  . Frequency of Social Gatherings with Friends and Family: Not on file  . Attends Religious Services: Not on file  . Active Member of Clubs or Organizations: Not on file  . Attends Archivist Meetings: Not on file  . Marital Status: Not on file  Intimate Partner Violence:   . Fear of Current or Ex-Partner: Not on file  . Emotionally Abused: Not on file  .  Physically Abused: Not on file  . Sexually Abused: Not on file    Past Surgical History:  Procedure Laterality Date  . NO PAST SURGERIES      Family History  Problem Relation Age of Onset  . Crohn's disease Mother   . Hypertension Mother     No Known Allergies  Current Outpatient Medications on File Prior to Visit  Medication Sig Dispense Refill  . metoprolol succinate (TOPROL-XL) 50 MG 24 hr tablet Take 1 tablet (50 mg total) by mouth daily. Take with or immediately following a meal. 30 tablet 0   No current facility-administered medications on file prior to visit.    BP (!) 139/97 (BP  Location: Right Arm, Cuff Size: Large)   Pulse 68   Temp (!) 97.3 F (36.3 C) (Temporal)   Resp 12   Ht 6\' 1"  (1.854 m)   Wt 225 lb (102.1 kg)   SpO2 100%   BMI 29.69 kg/m       Objective:   Physical Exam  General Mental Status- Alert. General Appearance- Not in acute distress.   Skin General: Color- Normal Color. Moisture- Normal Moisture.  Neck Carotid Arteries- Normal color. Moisture- Normal Moisture. No carotid bruits. No JVD.  Chest and Lung Exam Auscultation: Breath Sounds:-Normal.  Cardiovascular Auscultation:Rythm- Regular. Murmurs & Other Heart Sounds:Auscultation of the heart reveals- No Murmurs.  Abdomen Inspection:-Inspeection Normal. Palpation/Percussion:Note:No mass. Palpation and Percussion of the abdomen reveal- Non Tender, Non Distended + BS, no rebound or guarding.    Neurologic Cranial Nerve exam:- CN III-XII intact(No nystagmus), symmetric smile. Drift Test:- No drift. Heal to Toe Gait exam:-Normal. Finger to Nose:- Normal/Intact Strength:- 5/5 equal and symmetric strength both upper and lower extremities.      Assessment & Plan:  Your bp is trending downward since switching to metoprolol. Keep checking bp daily. In week if bp not close to 130/80 will add low dose amlodipine.  Keep healthy diet and exercise.   Decrease salt and caffeine.  Follow up my chart update on weekly bp readings. Will make adjustment if needed  20+ minutes spent with pt. 50% of time spent counseling pt on plan going forward.  Mackie Pai, PA-C

## 2019-07-25 NOTE — Patient Instructions (Addendum)
Your bp is trending downward since switching to metoprolol. Keep checking bp daily. In week if bp not close to 130/80 will add low dose amlodipine.  Keep healthy diet and exercise.   Decrease salt and caffeine.  Follow up my chart update on weekly bp readings. Will make adjustment if needed

## 2019-08-12 ENCOUNTER — Encounter: Payer: Self-pay | Admitting: Medical

## 2019-08-23 ENCOUNTER — Telehealth: Payer: Self-pay | Admitting: Medical

## 2019-08-23 NOTE — Telephone Encounter (Signed)
LVM for pt to call the office and schedule a Virtual Visit with provider.

## 2019-08-30 ENCOUNTER — Other Ambulatory Visit: Payer: Self-pay

## 2019-08-30 ENCOUNTER — Ambulatory Visit (INDEPENDENT_AMBULATORY_CARE_PROVIDER_SITE_OTHER): Payer: No Typology Code available for payment source | Admitting: Medical

## 2019-08-30 VITALS — BP 135/82

## 2019-08-30 DIAGNOSIS — I10 Essential (primary) hypertension: Secondary | ICD-10-CM

## 2019-08-30 DIAGNOSIS — Z8679 Personal history of other diseases of the circulatory system: Secondary | ICD-10-CM

## 2019-08-30 MED ORDER — METOPROLOL SUCCINATE ER 25 MG PO TB24: 25.0000 mg | ORAL_TABLET | Freq: Every day | ORAL | 0 refills | Status: DC

## 2019-08-30 MED FILL — METOPROLOL SUCCINATE ER 25: 25 | 90 days supply | Qty: 90 | Fill #0

## 2019-08-30 NOTE — Patient Instructions (Addendum)
Your bp has improved to mostly levels of 130/80. You can continue metoprolol 25 mg daily. If bp increases toward 140/90 then could add losartan 25 mg daily. Continue low salt diet, daily exercise and avoid alcohol.  For high cholesterol, low cholesterol diet and exercise. Can try krill oil or fish oil otc.  Follow up in 3 months or as needed

## 2019-08-30 NOTE — Progress Notes (Signed)
Subjective:    Patient ID: Dean Hughes, male    DOB: 04-10-1976, 44 y.o.   MRN: [id number]  HPI  Virtual Visit via Video Note  I connected with [name] on 08/30/19 at  3:20 PM EDT by a video enabled telemedicine application and verified that I am speaking with the correct person using two identifiers.  Location: Patient: home Provider: office   I discussed the limitations of evaluation and management by telemedicine and the availability of in person appointments. The patient expressed understanding and agreed to proceed.  History of Present Illness:  Pt bp recently has been 130/80. He states majority of the time bp is 130/80. He states rarely will see 140/90.  He notices on days when he is active bp is better.  Pt states he has been taking metoprolol. He stopped for one day and his pulse did increase. He talked with pharmacist and he decided to decrease his blood pressure med dose to 25 mg daily.(this is against the 50 mg dose that I recommended)   Pt states he notes alcohol made him dizzy and gave him ha. He stopped drinking alcohol.   He has noted caffeine increases bp as well.   Observations/Objective: General-no acute distress, pleasant, oriented. Lungs- on inspection lungs appear unlabored. Neck- no tracheal deviation or jvd on inspection. Neuro- gross motor function appears intact.  Assessment and Plan: Your bp has improved to mostly levels of 130/80. You can continue metoprolol 25 mg daily. If bp increases toward 140/90 then could add losartan 25 mg daily. Continue low salt diet, daily exercise and avoid alcohol.  For high cholesterol, low cholesterol diet and exercise. Can try krill oil or fish oil otc.  Follow up in 3 months or as needed  Mackie Pai, PA-C  Follow Up Instructions:    I discussed the assessment and treatment plan with the patient. The patient was provided an opportunity to ask questions and all were answered. The patient  agreed with the plan and demonstrated an understanding of the instructions.   The patient was advised to call back or seek an in-person evaluation if the symptoms worsen or if the condition fails to improve as anticipated.  I provided  30 minutes of non-face-to-face time during this encounter.   Mackie Pai, PA-C    Review of Systems  Constitutional: Negative for chills, fatigue and fever.  HENT: Negative for congestion and drooling.   Respiratory: Negative for cough, chest tightness, shortness of breath and wheezing.   Cardiovascular: Negative for chest pain and palpitations.  Gastrointestinal: Negative for abdominal pain.  Genitourinary: Negative for dysuria, penile swelling, testicular pain and urgency.  Musculoskeletal: Negative for back pain.  Skin: Negative for rash.  Neurological: Negative for dizziness, speech difficulty, weakness, light-headedness and headaches.       Pt dizziness resolved after decreasing metoprolol to 25 mg daiy.  Hematological: Negative for adenopathy. Does not bruise/bleed easily.  Psychiatric/Behavioral: Negative for behavioral problems and decreased concentration. The patient is not nervous/anxious.     Past Medical History:  Diagnosis Date  . PAF (paroxysmal atrial fibrillation) ([place]) 01/19/2016  . Panic attacks      Social History   Socioeconomic History  . Marital status: Married    Spouse name: Not on file  . Number of children: Not on file  . Years of education: Not on file  . Highest education level: Not on file  Occupational History  . Occupation: works Haematologist  Tobacco Use  . Smoking status:  Never Smoker  . Smokeless tobacco: Former Systems developer    Types: Chew  Substance and Sexual Activity  . Alcohol use: Yes    Alcohol/week: 2.0 standard drinks    Types: 2 Cans of beer per week    Comment: occasionally 1-2 drinks at night after work  . Drug use: Yes    Types: Marijuana    Comment: 2-3 times a week occasionally(he states  actually very rare)  . Sexual activity: Yes  Other Topics Concern  . Not on file  Social History Narrative  . Not on file   Social Determinants of Health   Financial Resource Strain:   . Difficulty of Paying Living Expenses:   Food Insecurity:   . Worried About Charity fundraiser in the Last Year:   . Arboriculturist in the Last Year:   Transportation Needs:   . Film/video editor (Medical):   Marland Kitchen Lack of Transportation (Non-Medical):   Physical Activity:   . Days of Exercise per Week:   . Minutes of Exercise per Session:   Stress:   . Feeling of Stress :   Social Connections:   . Frequency of Communication with Friends and Family:   . Frequency of Social Gatherings with Friends and Family:   . Attends Religious Services:   . Active Member of Clubs or Organizations:   . Attends Archivist Meetings:   Marland Kitchen Marital Status:   Intimate Partner Violence:   . Fear of Current or Ex-Partner:   . Emotionally Abused:   Marland Kitchen Physically Abused:   . Sexually Abused:     Past Surgical History:  Procedure Laterality Date  . NO PAST SURGERIES      Family History  Problem Relation Age of Onset  . Crohn's disease Mother   . Hypertension Mother     No Known Allergies  Current Outpatient Medications on File Prior to Visit  Medication Sig Dispense Refill  . metoprolol succinate (TOPROL-XL) 50 MG 24 hr tablet Take 1 tablet (50 mg total) by mouth daily. Take with or immediately following a meal. 30 tablet 0   No current facility-administered medications on file prior to visit.    BP 135/82       Objective:   Physical Exam        Assessment & Plan:

## 2019-09-09 ENCOUNTER — Ambulatory Visit: Payer: No Typology Code available for payment source | Admitting: Cardiology

## 2019-11-05 ENCOUNTER — Ambulatory Visit (INDEPENDENT_AMBULATORY_CARE_PROVIDER_SITE_OTHER): Payer: No Typology Code available for payment source | Admitting: Medical

## 2019-11-05 ENCOUNTER — Other Ambulatory Visit: Payer: Self-pay

## 2019-11-05 ENCOUNTER — Encounter: Payer: Self-pay | Admitting: Medical

## 2019-11-05 VITALS — BP 147/88 | HR 78 | Resp 18 | Ht 73.0 in | Wt 218.8 lb

## 2019-11-05 DIAGNOSIS — I10 Essential (primary) hypertension: Secondary | ICD-10-CM | POA: Diagnosis not present

## 2019-11-05 DIAGNOSIS — R600 Localized edema: Secondary | ICD-10-CM

## 2019-11-05 NOTE — Progress Notes (Signed)
   Subjective:    Patient ID: Dean Hughes, male    DOB: [birth year], 44 y.o.   MRN: [id number]  HPI  Pt in for follow up.  Pt does not check his blood pressure. When his wife checks will be 130/80. At highest will get diastolic of 90. Pt wife is nurse and checks with manual cuff. Pt also has wrist electronic monitor. He usually checks consecutively and with that machine will get 130/80. At most 140/90.  His bp here initially with our machine is higher.   Pt is only on metropol xl 25 mg daily.  No cardiac or neurologic signs/symptoms  Pt noted some slight swelling in back of rt ankle. Usually notes at end of the day. Also on Saturday tops of his feet were little swollen. No dyspnea on exertion.   He works at [employer].  Pt thinks beta blocker might be causing pedal edema. This is his concern.  With exercise and diet he has lost 10 lbs.      Review of Systems  Constitutional: Negative for chills, fatigue and fever.  Respiratory: Negative for cough, chest tightness, shortness of breath and wheezing.   Cardiovascular: Negative for chest pain and palpitations.  Gastrointestinal: Negative for abdominal pain.  Musculoskeletal:       See hpi.  Skin: Negative for rash.  Neurological: Negative for dizziness, tremors, speech difficulty, weakness, numbness and headaches.  Hematological: Negative for adenopathy. Does not bruise/bleed easily.       Objective:   Physical Exam  General- No acute distress. Pleasant patient. Neck- Full range of motion, no jvd Lungs- Clear, even and unlabored. Heart- regular rate and rhythm. Neurologic- CNII- XII grossly intact.  Lower ext- calfs symmetric. Negative homans sign. No pedal edema presently.       Assessment & Plan:  Your bp is well controlled at home. Continue metoprolol low dose. Continue to have your wife who is nurse check your bp. Make sure bp not exceeding 140/90. I am happy with most of reading you get at 130/80. I  think you have component of white coat hypertension.  For pedal edema recommend that you use compression socks on day that you will be on your feet all day.  Also get cxr due to htn and recent pedal edema. Will get estimate on heart size.  I don't think you need diuretic based on exam.  Follow up 3 months or as needed  Mackie Pai, PA-C   Time spent with patient today was 35  minutes which consisted of chart review, discussing diagnosis, work up, treatment, answering questions  and documentation.

## 2019-11-05 NOTE — Patient Instructions (Addendum)
Your bp is well controlled at home. Continue metoprolol low dose. Continue to have your wife who is nurse check your bp. Make sure bp not exceeding 140/90. I am happy with most of reading you get at 130/80. I think you have component of white coat hypertension.  For pedal edema recommend that you use compression socks on day that you will be on your feet all day.  Also get cxr due to htn and recent pedal edema. Will get estimate on heart size.  I don't think you need diuretic based on exam.  Follow up 3 months or as needed

## 2019-12-11 ENCOUNTER — Other Ambulatory Visit: Payer: Self-pay | Admitting: Medical

## 2019-12-11 MED FILL — METOPROLOL SUCCINATE ER 25: 25 | 90 days supply | Qty: 90 | Fill #0

## 2020-03-17 MED FILL — METOPROLOL SUCCINATE ER 25: 25 | 30 days supply | Qty: 30 | Fill #0

## 2020-04-17 ENCOUNTER — Other Ambulatory Visit: Payer: Self-pay | Admitting: Medical

## 2020-04-17 ENCOUNTER — Telehealth: Payer: Self-pay | Admitting: *Deleted

## 2020-04-17 MED FILL — METOPROLOL SUCCINATE ER 25: 25 | 30 days supply | Qty: 30 | Fill #0

## 2020-04-17 NOTE — Telephone Encounter (Signed)
error 

## 2020-05-15 ENCOUNTER — Other Ambulatory Visit: Payer: Self-pay | Admitting: Medical

## 2020-05-15 MED FILL — METOPROLOL SUCCINATE ER 25: 25 | 30 days supply | Qty: 30 | Fill #0

## 2020-06-16 ENCOUNTER — Encounter: Payer: Self-pay | Admitting: Medical

## 2020-06-16 ENCOUNTER — Ambulatory Visit (INDEPENDENT_AMBULATORY_CARE_PROVIDER_SITE_OTHER): Payer: No Typology Code available for payment source | Admitting: Medical

## 2020-06-16 ENCOUNTER — Other Ambulatory Visit: Payer: Self-pay | Admitting: Medical

## 2020-06-16 ENCOUNTER — Other Ambulatory Visit: Payer: Self-pay

## 2020-06-16 VITALS — BP 140/82 | HR 76 | Temp 98.5°F | Ht 73.0 in | Wt 220.0 lb

## 2020-06-16 DIAGNOSIS — I10 Essential (primary) hypertension: Secondary | ICD-10-CM | POA: Diagnosis not present

## 2020-06-16 DIAGNOSIS — K140 Glossitis: Secondary | ICD-10-CM

## 2020-06-16 DIAGNOSIS — H9311 Tinnitus, right ear: Secondary | ICD-10-CM

## 2020-06-16 LAB — COMPREHENSIVE METABOLIC PANEL
ALT: 22 U/L (ref 0–53)
AST: 16 U/L (ref 0–37)
Albumin: 4.5 g/dL (ref 3.5–5.2)
Alkaline Phosphatase: 74 U/L (ref 39–117)
BUN: 16 mg/dL (ref 6–23)
CO2: 29 mEq/L (ref 19–32)
Calcium: 9.4 mg/dL (ref 8.4–10.5)
Chloride: 102 mEq/L (ref 96–112)
Creatinine, Ser: 0.99 mg/dL (ref 0.40–1.50)
GFR: 92.45 mL/min (ref >60.00)
Glucose, Bld: 93 mg/dL (ref 70–99)
Potassium: 4.1 mEq/L (ref 3.5–5.1)
Sodium: 136 mEq/L (ref 135–145)
Total Bilirubin: 0.7 mg/dL (ref 0.2–1.2)
Total Protein: 6.8 g/dL (ref 6.0–8.3)

## 2020-06-16 LAB — LIPID PANEL
Cholesterol: 185 mg/dL (ref 0–200)
HDL: 36.5 mg/dL — ABNORMAL LOW (ref >39.00)
LDL Cholesterol: 128 mg/dL — ABNORMAL HIGH (ref 0–99)
NonHDL: 148.56
Total CHOL/HDL Ratio: 5
Triglycerides: 102 mg/dL (ref 0.0–149.0)
VLDL: 20.4 mg/dL (ref 0.0–40.0)

## 2020-06-16 MED ORDER — METOPROLOL SUCCINATE ER 25 MG PO TB24: ORAL_TABLET | ORAL | 3 refills | Status: DC

## 2020-06-16 MED FILL — METOPROLOL SUCCINATE ER 25: 25 | 90 days supply | Qty: 90 | Fill #0

## 2020-06-16 NOTE — Progress Notes (Signed)
Subjective:    Patient ID: Dean Hughes, male    DOB: 1976-02-29, 45 y.o.   MRN: [id number]  HPI Pt in for follow up.  Pt states is feeling okay with no acute problems.  He has history of hypertension and does not check his blood pressure routinely.  He notes that when he does check his blood pressure himself that it tends to stress him out.  His wife is a Engineer, civil (consulting) and checks his blood pressure occasionally manually.  Last time she checked his blood pressure is 140/90 Max.  However he states that if the will wait and recheck blood pressure 5 to 10 minutes later often his blood pressure is 130/80 range.  Patient also notes that he had some faint discoloration of his tongue couple weeks ago.  He thought after doing some research that it could have been glossitis.  He mentions that he does take some B12 on occasion.  He states that the tongue appearance has now normalized.  Also he reports some occasional transient low level tinnitus right ear.  No dizziness or vertigo associated.  No headache associated either.  He does not check his blood pressure when he has had transient low level tinnitus.  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     Review of Systems  Constitutional: Negative for chills and fever.  Eyes: Negative for photophobia.  Respiratory: Negative for cough and shortness of breath.   Cardiovascular: Negative for chest pain and palpitations.  Gastrointestinal: Negative for abdominal pain.  Musculoskeletal: Negative for back pain.  Skin: Negative for rash.  Neurological: Negative for dizziness, tremors, seizures and weakness.  Psychiatric/Behavioral: Negative for behavioral problems, confusion, dysphoric mood, hallucinations and suicidal ideas. The patient is not nervous/anxious.     Past Medical History:  Diagnosis Date  . PAF (paroxysmal atrial fibrillation) ([facility]) 01/19/2016  . Panic attacks      Social History    Socioeconomic History  . Marital status: Married    Spouse name: Not on file  . Number of children: Not on file  . Years of education: Not on file  . Highest education level: Not on file  Occupational History  . Occupation: works Magazine features editor  Tobacco Use  . Smoking status: Never Smoker  . Smokeless tobacco: Former Neurosurgeon    Types: Engineer, drilling  . Vaping Use: Never used  Substance and Sexual Activity  . Alcohol use: Yes    Alcohol/week: 2.0 standard drinks    Types: 2 Cans of beer per week    Comment: occasionally 1-2 drinks at night after work  . Drug use: Yes    Types: Marijuana    Comment: 2-3 times a week occasionally(he states actually very rare)  . Sexual activity: Yes  Other Topics Concern  . Not on file  Social History Narrative  . Not on file   Social Determinants of Health   Financial Resource Strain: Not on file  Food Insecurity: Not on file  Transportation Needs: Not on file  Physical Activity: Not on file  Stress: Not on file  Social Connections: Not on file  Intimate Partner Violence: Not on file    Past Surgical History:  Procedure Laterality Date  . NO PAST SURGERIES      Family History  Problem Relation Age of Onset  . Crohn's disease Mother   . Hypertension Mother     No Known Allergies  No current outpatient  medications on file prior to visit.   No current facility-administered medications on file prior to visit.    BP 140/82   Pulse 76   Temp 98.5 F (36.9 C)   Ht 6\' 1"  (1.854 m)   Wt 220 lb (99.8 kg)   SpO2 100%   BMI 29.03 kg/m       Objective:   Physical Exam  General Mental Status- Alert. General Appearance- Not in acute distress.   Skin General: Color- Normal Color. Moisture- Normal Moisture.  Neck Carotid Arteries- Normal color. Moisture- Normal Moisture. No carotid bruits. No JVD.  Chest and Lung Exam Auscultation: Breath Sounds:-Normal.  Cardiovascular Auscultation:Rythm- Regular. Murmurs &  Other Heart Sounds:Auscultation of the heart reveals- No Murmurs.  Abdomen Inspection:-Inspeection Normal. Palpation/Percussion:Note:No mass. Palpation and Percussion of the abdomen reveal- Non Tender, Non Distended + BS, no rebound or guarding.    Neurologic Cranial Nerve exam:- CN III-XII intact(No nystagmus).  heent-both ear canals are clear with normal TMs.  Tongue-on inspection does not show any acute abnormality.       Assessment & Plan:  History of hypertension with borderline blood pressure today.  Counseled that prefer blood pressure be closer to 130/80.  Would asked that she get your wife check your blood pressure later today and send me a blood pressure update via MyChart.  Would place at home reading in your note today if better.  Continue with metoprolol same dose.  90-day prescription with refills sent to pharmacy.  You describe possible glossitis recently on self exam.  On today's exam your tongue does look to be within normal limits.  Offered glossitis labs today but you declined.  If you change your mind just let me know and I will put in those labs.  History of mild intermittent tinnitus.  If your tinnitus worsens please let us know.  Providing tinnitus education to review today.  Follow-up date to be determined after lab review.  Typically recommend at least 6 month for chronic conditions possibly sooner if labs abnormal.  Mackie Pai, PA-C   Time spent with patient today was 30  minutes which consisted of chart review, discussing diagnoses, work up, treatment, answering questions and documentation.  Note epic was down and I could not access patient's chart at time of exam/interview so time of 12 minutes only reflects documentation time.  Once epic came up documented later.

## 2020-06-16 NOTE — Patient Instructions (Signed)
History of hypertension with borderline blood pressure today.  Counseled that prefer blood pressure be closer to 130/80.  Would asked that she get your wife check your blood pressure later today and send me a blood pressure update via MyChart.  Would place at home reading in your note today if better.  Continue with metoprolol same dose.  90-day prescription with refills sent to pharmacy.  You describe possible glossitis recently on self exam.  On today's exam your tongue does look to be within normal limits.  Offered glossitis labs today but you declined.  If you change your mind just let me know and I will put in those labs.  History of mild intermittent tinnitus.  If your tinnitus worsens please let us know.  Providing tinnitus education to review today.  Follow-up date to be determined after lab review.  Typically recommend at least 6 month for chronic conditions possibly sooner if labs abnormal.

## 2020-09-01 ENCOUNTER — Other Ambulatory Visit (HOSPITAL_BASED_OUTPATIENT_CLINIC_OR_DEPARTMENT_OTHER): Payer: Self-pay

## 2020-09-14 MED FILL — Metoprolol Succinate Tab ER 24HR 25 MG (Tartrate Equiv): ORAL | 90 days supply | Qty: 90 | Fill #0 | Status: AC

## 2020-09-15 ENCOUNTER — Other Ambulatory Visit (HOSPITAL_COMMUNITY): Payer: Self-pay

## 2020-09-16 ENCOUNTER — Other Ambulatory Visit (HOSPITAL_COMMUNITY): Payer: Self-pay

## 2020-12-11 ENCOUNTER — Other Ambulatory Visit (HOSPITAL_COMMUNITY): Payer: Self-pay

## 2020-12-11 MED FILL — Metoprolol Succinate Tab ER 24HR 25 MG (Tartrate Equiv): ORAL | 90 days supply | Qty: 90 | Fill #1 | Status: AC

## 2020-12-15 ENCOUNTER — Ambulatory Visit: Payer: No Typology Code available for payment source | Admitting: Medical

## 2021-01-05 ENCOUNTER — Other Ambulatory Visit: Payer: Self-pay

## 2021-01-05 ENCOUNTER — Encounter: Payer: Self-pay | Admitting: Medical

## 2021-01-05 ENCOUNTER — Ambulatory Visit (INDEPENDENT_AMBULATORY_CARE_PROVIDER_SITE_OTHER): Payer: No Typology Code available for payment source | Admitting: Medical

## 2021-01-05 VITALS — BP 122/79 | HR 69 | Temp 98.4°F | Resp 18 | Ht 73.0 in | Wt 222.2 lb

## 2021-01-05 DIAGNOSIS — R5383 Other fatigue: Secondary | ICD-10-CM | POA: Diagnosis not present

## 2021-01-05 DIAGNOSIS — I10 Essential (primary) hypertension: Secondary | ICD-10-CM | POA: Diagnosis not present

## 2021-01-05 DIAGNOSIS — Z8679 Personal history of other diseases of the circulatory system: Secondary | ICD-10-CM | POA: Diagnosis not present

## 2021-01-05 DIAGNOSIS — Z1211 Encounter for screening for malignant neoplasm of colon: Secondary | ICD-10-CM

## 2021-01-05 DIAGNOSIS — Z0000 Encounter for general adult medical examination without abnormal findings: Secondary | ICD-10-CM

## 2021-01-05 LAB — CBC WITH DIFFERENTIAL/PLATELET
Basophils Absolute: 0 10*3/uL (ref 0.0–0.1)
Basophils Relative: 0.6 % (ref 0.0–3.0)
Eosinophils Absolute: 0.1 10*3/uL (ref 0.0–0.7)
Eosinophils Relative: 3 % (ref 0.0–5.0)
HCT: 45.5 % (ref 39.0–52.0)
Hemoglobin: 15.1 g/dL (ref 13.0–17.0)
Lymphocytes Relative: 34.2 % (ref 12.0–46.0)
Lymphs Abs: 1.7 10*3/uL (ref 0.7–4.0)
MCHC: 33.2 g/dL (ref 30.0–36.0)
MCV: 85 fl (ref 78.0–100.0)
Monocytes Absolute: 0.5 10*3/uL (ref 0.1–1.0)
Monocytes Relative: 10.6 % (ref 3.0–12.0)
Neutro Abs: 2.5 10*3/uL (ref 1.4–7.7)
Neutrophils Relative %: 51.6 % (ref 43.0–77.0)
Platelets: 173 10*3/uL (ref 150.0–400.0)
RBC: 5.35 Mil/uL (ref 4.22–5.81)
RDW: 13.5 % (ref 11.5–15.5)
WBC: 4.9 10*3/uL (ref 4.0–10.5)

## 2021-01-05 LAB — COMPREHENSIVE METABOLIC PANEL
ALT: 23 U/L (ref 0–53)
AST: 18 U/L (ref 0–37)
Albumin: 4.4 g/dL (ref 3.5–5.2)
Alkaline Phosphatase: 74 U/L (ref 39–117)
BUN: 15 mg/dL (ref 6–23)
CO2: 27 mEq/L (ref 19–32)
Calcium: 9.5 mg/dL (ref 8.4–10.5)
Chloride: 102 mEq/L (ref 96–112)
Creatinine, Ser: 1 mg/dL (ref 0.40–1.50)
GFR: 90.98 mL/min (ref >60.00)
Glucose, Bld: 94 mg/dL (ref 70–99)
Potassium: 4.6 mEq/L (ref 3.5–5.1)
Sodium: 138 mEq/L (ref 135–145)
Total Bilirubin: 0.8 mg/dL (ref 0.2–1.2)
Total Protein: 7 g/dL (ref 6.0–8.3)

## 2021-01-05 LAB — T4, FREE: Free T4: 0.89 ng/dL (ref 0.60–1.60)

## 2021-01-05 LAB — LIPID PANEL
Cholesterol: 183 mg/dL (ref 0–200)
HDL: 37.5 mg/dL — ABNORMAL LOW (ref >39.00)
LDL Cholesterol: 120 mg/dL — ABNORMAL HIGH (ref 0–99)
NonHDL: 145.59
Total CHOL/HDL Ratio: 5
Triglycerides: 128 mg/dL (ref 0.0–149.0)
VLDL: 25.6 mg/dL (ref 0.0–40.0)

## 2021-01-05 LAB — VITAMIN B12: Vitamin B-12: 1164 pg/mL — ABNORMAL HIGH (ref 211–911)

## 2021-01-05 LAB — TSH: TSH: 1.25 u[IU]/mL (ref 0.35–5.50)

## 2021-01-05 NOTE — Patient Instructions (Addendum)
For you wellness exam today I have ordered cbc, cmp and lipid panel.  Vaccine up to date. Consider covid booster.  Recommend exercise and healthy diet.  We will let you know lab results as they come in.  Follow up date appointment will be determined after lab review.    Referral to gi placed for screening colonoscopy.  For fatigue and concern for glossitis. Ordered b12 and b1.  For hx of atrial fibrillation and htn continue low dose b blocker. Referred back to cardiologist since not seen since 2018.  Preventive Care 45-45 Years Old, Male Preventive care refers to lifestyle choices and visits with your health care provider that can promote health and wellness. This includes: A yearly physical exam. This is also called an annual wellness visit. Regular dental and eye exams. Immunizations. Screening for certain conditions. Healthy lifestyle choices, such as: Eating a healthy diet. Getting regular exercise. Not using drugs or products that contain nicotine and tobacco. Limiting alcohol use. What can I expect for my preventive care visit? Physical exam Your health care provider will check your: Height and weight. These may be used to calculate your BMI (body mass index). BMI is a measurement that tells if you are at a healthy weight. Heart rate and blood pressure. Body temperature. Skin for abnormal spots. Counseling Your health care provider may ask you questions about your: Past medical problems. Family's medical history. Alcohol, tobacco, and drug use. Emotional well-being. Home life and relationship well-being. Sexual activity. Diet, exercise, and sleep habits. Work and work Statistician. Access to firearms. What immunizations do I need?  Vaccines are usually given at various ages, according to a schedule. Your health care provider will recommend vaccines for you based on your age, medicalhistory, and lifestyle or other factors, such as travel or where you work. What  tests do I need? Blood tests Lipid and cholesterol levels. These may be checked every 5 years, or more often if you are over 20 years old. Hepatitis C test. Hepatitis B test. Screening Lung cancer screening. You may have this screening every year starting at age 11 if you have a 30-pack-year history of smoking and currently smoke or have quit within the past 15 years. Prostate cancer screening. Recommendations will vary depending on your family history and other risks. Genital exam to check for testicular cancer or hernias. Colorectal cancer screening. All adults should have this screening starting at age 49 and continuing until age 72. Your health care provider may recommend screening at age 28 if you are at increased risk. You will have tests every 1-10 years, depending on your results and the type of screening test. Diabetes screening. This is done by checking your blood sugar (glucose) after you have not eaten for a while (fasting). You may have this done every 1-3 years. STD (sexually transmitted disease) testing, if you are at risk. Follow these instructions at home: Eating and drinking  Eat a diet that includes fresh fruits and vegetables, whole grains, lean protein, and low-fat dairy products. Take vitamin and mineral supplements as recommended by your health care provider. Do not drink alcohol if your health care provider tells you not to drink. If you drink alcohol: Limit how much you have to 0-2 drinks a day. Be aware of how much alcohol is in your drink. In the [place] one drink equals one 12 oz bottle of beer (355 mL), one 5 oz glass of wine (148 mL), or one 1 oz glass of hard liquor (44 mL).  Lifestyle Take daily care of your teeth and gums. Brush your teeth every morning and night with fluoride toothpaste. Floss one time each day. Stay active. Exercise for at least 30 minutes 5 or more days each week. Do not use any products that contain nicotine or tobacco, such as  cigarettes, e-cigarettes, and chewing tobacco. If you need help quitting, ask your health care provider. Do not use drugs. If you are sexually active, practice safe sex. Use a condom or other form of protection to prevent STIs (sexually transmitted infections). If told by your health care provider, take low-dose aspirin daily starting at age 15. Find healthy ways to cope with stress, such as: Meditation, yoga, or listening to music. Journaling. Talking to a trusted person. Spending time with friends and family. Safety Always wear your seat belt while driving or riding in a vehicle. Do not drive: If you have been drinking alcohol. Do not ride with someone who has been drinking. When you are tired or distracted. While texting. Wear a helmet and other protective equipment during sports activities. If you have firearms in your house, make sure you follow all gun safety procedures. What's next? Go to your health care provider once a year for an annual wellness visit. Ask your health care provider how often you should have your eyes and teeth checked. Stay up to date on all vaccines. This information is not intended to replace advice given to you by your health care provider. Make sure you discuss any questions you have with your healthcare provider. Document Revised: 02/26/2019 Document Reviewed: 05/24/2018 Elsevier Patient Education  2022 [employer].

## 2021-01-05 NOTE — Progress Notes (Addendum)
Subjective:    Patient ID: Dean Hughes, male    DOB: 11/24/1975, 45 y.o.   MRN: [id number]  HPI  Pt in today for follow up on chronic problems and decided to go ahead and do wellness exam. Wife [employer] employee.   Pt has been exercising. He is drinking alcohol very lightly. Small amounts. Overall diet healthy. One cup of coffee a day.  Placing referral to Gi today for screening colonoscopy.  Had covid vaccine series.   Minimal fatigue. Pt thinks has mid glossitis.   Hx of htn. Pt checks his bp about 2 times a week. When he checks at home often in range mid to high 130/80. Approximate 135/85. Pt is on toprol xl 25 mg daily. Pt has been using and he takes daily. No side effects reported.   Hx of atrial fibrillation in the past. Last ekg in our office nsr. Pt had seen cardiologist in 2018. Pt had been on cardizem in the past. Was not on anticoagulation. Echo was normal. Pt states he never took cardizem. He treated as just prn med.       Review of Systems  Constitutional:  Positive for fatigue. Negative for chills and fever.  HENT:  Negative for congestion and drooling.   Respiratory:  Negative for cough, chest tightness, shortness of breath and wheezing.   Cardiovascular:  Negative for chest pain and palpitations.  Gastrointestinal:  Negative for abdominal distention, anal bleeding, blood in stool, constipation, diarrhea and nausea.  Genitourinary:  Negative for dysuria, flank pain and frequency.  Musculoskeletal:  Negative for back pain, joint swelling and myalgias.  Skin:  Negative for rash.  Psychiatric/Behavioral:  Negative for confusion, dysphoric mood and suicidal ideas. The patient is not nervous/anxious.        Brief bouts of depression. Related to family circumstances.    Past Medical History:  Diagnosis Date   PAF (paroxysmal atrial fibrillation) ([place]) 01/19/2016   Panic attacks      Social History   Socioeconomic History   Marital status: Married    Spouse  name: Not on file   Number of children: Not on file   Years of education: Not on file   Highest education level: Not on file  Occupational History   Occupation: works Haematologist  Tobacco Use   Smoking status: Never   Smokeless tobacco: Former    Types: Chew    Quit date: 09/19/2018  Vaping Use   Vaping Use: Never used  Substance and Sexual Activity   Alcohol use: Yes    Alcohol/week: 2.0 standard drinks    Types: 2 Cans of beer per week    Comment: occasionally 1-2 drinks at night after work   Drug use: Yes    Types: Marijuana    Comment: 2-3 times a week occasionally(he states actually very rare)   Sexual activity: Yes  Other Topics Concern   Not on file  Social History Narrative   Not on file   Social Determinants of Health   Financial Resource Strain: Not on file  Food Insecurity: Not on file  Transportation Needs: Not on file  Physical Activity: Not on file  Stress: Not on file  Social Connections: Not on file  Intimate Partner Violence: Not on file    Past Surgical History:  Procedure Laterality Date   NO PAST SURGERIES      Family History  Problem Relation Age of Onset   Crohn's disease Mother    Hypertension Mother  No Known Allergies  Current Outpatient Medications on File Prior to Visit  Medication Sig Dispense Refill   metoprolol succinate (TOPROL-XL) 25 MG 24 hr tablet TAKE 1 TABLET BY MOUTH DAILY *NEEDS OFFICE VISIT 90 tablet 3   Omega-3 Fatty Acids (FISH OIL PO) Take 1 capsule by mouth every other day.     No current facility-administered medications on file prior to visit.    BP (!) 156/100   Pulse 69   Temp 98.4 F (36.9 C)   Resp 18   Ht '6\' 1"'$  (1.854 m)   Wt 222 lb 3.2 oz (100.8 kg)   SpO2 100%   BMI 29.32 kg/m       Objective:   Physical Exam  General Mental Status- Alert. General Appearance- Not in acute distress.   Skin General: Color- Normal Color. Moisture- Normal Moisture.  Neck Carotid Arteries- Normal  color. Moisture- Normal Moisture. No carotid bruits. No JVD.  Chest and Lung Exam Auscultation: Breath Sounds:-Normal.  Cardiovascular Auscultation:Rythm- Regular. Murmurs & Other Heart Sounds:Auscultation of the heart reveals- No Murmurs.  Abdomen Inspection:-Inspeection Normal. Palpation/Percussion:Note:No mass. Palpation and Percussion of the abdomen reveal- Non Tender, Non Distended + BS, no rebound or guarding.   Neurologic Cranial Nerve exam:- CN III-XII intact(No nystagmus), symmetric smile. Strength:- 5/5 equal and symmetric strength both upper and lower extremities.       Assessment & Plan:  For you wellness exam today I have ordered cbc, cmp and lipid panel.  Vaccine up to date. Consider covid booster.  Recommend exercise and healthy diet.  We will let you know lab results as they come in.  Follow up date appointment will be determined after lab review.    Referral to gi placed for screening colonoscopy.  For fatigue and concern for glossitis. Ordered b12 and b1.  For hx of atrial fibrillation and htn continue low dose b blocker. Referred back to cardiologist since not seen since 2018.  Mackie Pai, PA-C   [phone] charge as well. Addressed htn, hx of atrial fibrillation, fatigue and concern for glossitis.

## 2021-01-09 LAB — VITAMIN B1: Vitamin B1 (Thiamine): 9 nmol/L (ref 8–30)

## 2021-02-23 ENCOUNTER — Ambulatory Visit (AMBULATORY_SURGERY_CENTER): Payer: No Typology Code available for payment source

## 2021-02-23 ENCOUNTER — Other Ambulatory Visit (HOSPITAL_COMMUNITY): Payer: Self-pay

## 2021-02-23 ENCOUNTER — Other Ambulatory Visit: Payer: Self-pay

## 2021-02-23 VITALS — Ht 73.0 in | Wt 220.0 lb

## 2021-02-23 DIAGNOSIS — Z1211 Encounter for screening for malignant neoplasm of colon: Secondary | ICD-10-CM

## 2021-02-23 MED ORDER — NA SULFATE-K SULFATE-MG SULF 17.5-3.13-1.6 GM/177ML PO SOLN
1.0000 | Freq: Once | ORAL | 0 refills | Status: AC
  Filled 2021-02-23: qty 354, 1d supply, fill #0

## 2021-02-23 NOTE — Progress Notes (Signed)
Pre visit completed via phone call; Patient verified name, DOB, and address; No egg or soy allergy known to patient  No issues known to pt with past sedation with any surgeries or procedures Patient denies ever being told they had issues or difficulty with intubation  No FH of Malignant Hyperthermia Pt is not on diet pills Pt is not on  home 02  Pt is not on blood thinners  Pt denies issues with constipation  No A flutter  AFIB dx (one time incident) EMMI video via MyChart  COVID 19 guidelines implemented in PV today with Pt and RN  Pt is fully vaccinated for Covid x 2; NO PA's for preps discussed with pt in PV today  Discussed with pt there will be an out-of-pocket cost for prep and that varies from $0 to 70 +  dollars  Due to the COVID-19 pandemic we are asking patients to follow certain guidelines.  Pt aware of COVID protocols and [facility] guidelines

## 2021-03-03 ENCOUNTER — Telehealth: Payer: Self-pay | Admitting: Gastroenterology

## 2021-03-03 NOTE — Telephone Encounter (Signed)
Patient called states he tested positive for Covid-19 today and has a colonoscopy scheduled for 03/09/21 seeking advise.

## 2021-03-03 NOTE — Telephone Encounter (Signed)
Patient states he tested + for coivd on 03/02/21-he has "stuffy head" only. Patient has covid vaccines x2 1 year ago per pt-no booster. Per policy patient was rescheduled for [facility] colon 10 days past covid + test. New prep instructions sent to patient's Mychart. I explained to patient and wife our covid policy.

## 2021-03-09 ENCOUNTER — Encounter: Payer: No Typology Code available for payment source | Admitting: Gastroenterology

## 2021-03-12 ENCOUNTER — Other Ambulatory Visit (HOSPITAL_COMMUNITY): Payer: Self-pay

## 2021-03-12 MED FILL — Metoprolol Succinate Tab ER 24HR 25 MG (Tartrate Equiv): ORAL | 90 days supply | Qty: 90 | Fill #2 | Status: AC

## 2021-03-17 ENCOUNTER — Encounter: Payer: Self-pay | Admitting: Gastroenterology

## 2021-03-17 ENCOUNTER — Ambulatory Visit (AMBULATORY_SURGERY_CENTER): Payer: No Typology Code available for payment source | Admitting: Gastroenterology

## 2021-03-17 ENCOUNTER — Other Ambulatory Visit: Payer: Self-pay

## 2021-03-17 VITALS — BP 116/83 | HR 66 | Temp 98.6°F | Resp 16 | Ht 73.0 in | Wt 220.0 lb

## 2021-03-17 DIAGNOSIS — D125 Benign neoplasm of sigmoid colon: Secondary | ICD-10-CM | POA: Diagnosis not present

## 2021-03-17 DIAGNOSIS — D124 Benign neoplasm of descending colon: Secondary | ICD-10-CM

## 2021-03-17 DIAGNOSIS — Z1211 Encounter for screening for malignant neoplasm of colon: Secondary | ICD-10-CM

## 2021-03-17 DIAGNOSIS — D128 Benign neoplasm of rectum: Secondary | ICD-10-CM | POA: Diagnosis not present

## 2021-03-17 DIAGNOSIS — D129 Benign neoplasm of anus and anal canal: Secondary | ICD-10-CM

## 2021-03-17 DIAGNOSIS — D123 Benign neoplasm of transverse colon: Secondary | ICD-10-CM | POA: Diagnosis not present

## 2021-03-17 DIAGNOSIS — D127 Benign neoplasm of rectosigmoid junction: Secondary | ICD-10-CM | POA: Diagnosis not present

## 2021-03-17 MED ORDER — SODIUM CHLORIDE 0.9 % IV SOLN: 500.0000 mL | Freq: Once | INTRAVENOUS | Status: DC

## 2021-03-17 NOTE — Progress Notes (Signed)
Called to room to assist during endoscopic procedure.  Patient ID and intended procedure confirmed with present staff. Received instructions for my participation in the procedure from the performing physician.  

## 2021-03-17 NOTE — Progress Notes (Signed)
[facility]-VS Pt's states no medical or surgical changes since previsit or office visit.

## 2021-03-17 NOTE — Progress Notes (Signed)
Last used chew on 03-15-21 per pt

## 2021-03-17 NOTE — Patient Instructions (Signed)
Discharge instructions given. ?Handouts on polyps and Hemorrhoids. ?Resume previous medications. ?YOU HAD AN ENDOSCOPIC PROCEDURE TODAY AT THE [facility]:   Refer to the procedure report that was given to you for any specific questions about what was found during the examination.  If the procedure report does not answer your questions, please call your gastroenterologist to clarify.  If you requested that your care partner not be given the details of your procedure findings, then the procedure report has been included in a sealed envelope for you to review at your convenience later. ? ?YOU SHOULD EXPECT: Some feelings of bloating in the abdomen. Passage of more gas than usual.  Walking can help get rid of the air that was put into your GI tract during the procedure and reduce the bloating. If you had a lower endoscopy (such as a colonoscopy or flexible sigmoidoscopy) you may notice spotting of blood in your stool or on the toilet paper. If you underwent a bowel prep for your procedure, you may not have a normal bowel movement for a few days. ? ?Please Note:  You might notice some irritation and congestion in your nose or some drainage.  This is from the oxygen used during your procedure.  There is no need for concern and it should clear up in a day or so. ? ?SYMPTOMS TO REPORT IMMEDIATELY: ? ?Following lower endoscopy (colonoscopy or flexible sigmoidoscopy): ? Excessive amounts of blood in the stool ? Significant tenderness or worsening of abdominal pains ? Swelling of the abdomen that is new, acute ? Fever of 100?F or higher ? ? ?For urgent or emergent issues, a gastroenterologist can be reached at any hour by calling [phone]. ?Do not use MyChart messaging for urgent concerns.  ? ? ?DIET:  We do recommend a small meal at first, but then you may proceed to your regular diet.  Drink plenty of fluids but you should avoid alcoholic beverages for 24 hours. ? ?ACTIVITY:  You should plan to take it  easy for the rest of today and you should NOT DRIVE or use heavy machinery until tomorrow (because of the sedation medicines used during the test).   ? ?FOLLOW UP: ?Our staff will call the number listed on your records 48-72 hours following your procedure to check on you and address any questions or concerns that you may have regarding the information given to you following your procedure. If we do not reach you, we will leave a message.  We will attempt to reach you two times.  During this call, we will ask if you have developed any symptoms of COVID 19. If you develop any symptoms (ie: fever, flu-like symptoms, shortness of breath, cough etc.) before then, please call [phone].  If you test positive for Covid 19 in the 2 weeks post procedure, please call and report this information to us.   ? ?If any biopsies were taken you will be contacted by phone or by letter within the next 1-3 weeks.  Please call us at [phone] if you have not heard about the biopsies in 3 weeks.  ? ? ?SIGNATURES/CONFIDENTIALITY: ?You and/or your care partner have signed paperwork which will be entered into your electronic medical record.  These signatures attest to the fact that that the information above on your After Visit Summary has been reviewed and is understood.  Full responsibility of the confidentiality of this discharge information lies with you and/or your care-partner.  ?

## 2021-03-17 NOTE — Progress Notes (Signed)
GASTROENTEROLOGY PROCEDURE H&P NOTE   Primary Care Physician: Mackie Pai, PA-C  HPI: Dean Hughes is a 45 y.o. male who presents for Colonoscopy for screening.  Past Medical History:  Diagnosis Date   GERD (gastroesophageal reflux disease)    with certain foods   Hypertension    on meds   PAF (paroxysmal atrial fibrillation) ([place]) 01/19/2016   one time incident   Panic attacks    situational   Seasonal allergies    Past Surgical History:  Procedure Laterality Date   NO PAST SURGERIES     Current Outpatient Medications  Medication Sig Dispense Refill   GARLIC PO Take 1 tablet by mouth daily at 6 (six) AM.     metoprolol succinate (TOPROL-XL) 25 MG 24 hr tablet TAKE 1 TABLET BY MOUTH DAILY *NEEDS OFFICE VISIT 90 tablet 3   Omega-3 Fatty Acids (FISH OIL PO) Take 1 capsule by mouth every other day.     Current Facility-Administered Medications  Medication Dose Route Frequency Provider Last Rate Last Admin   0.9 %  sodium chloride infusion  500 mL Intravenous Once Mansouraty, Telford Nab., MD        Current Outpatient Medications:    GARLIC PO, Take 1 tablet by mouth daily at 6 (six) AM., Disp: , Rfl:    metoprolol succinate (TOPROL-XL) 25 MG 24 hr tablet, TAKE 1 TABLET BY MOUTH DAILY *NEEDS OFFICE VISIT, Disp: 90 tablet, Rfl: 3   Omega-3 Fatty Acids (FISH OIL PO), Take 1 capsule by mouth every other day., Disp: , Rfl:   Current Facility-Administered Medications:    0.9 %  sodium chloride infusion, 500 mL, Intravenous, Once, Mansouraty, Telford Nab., MD No Known Allergies Family History  Problem Relation Age of Onset   Colitis Mother    Hypertension Mother    Crohn's disease Maternal Uncle    Crohn's disease Other    Colon polyps Neg Hx    Colon cancer Neg Hx    Esophageal cancer Neg Hx    Rectal cancer Neg Hx    Stomach cancer Neg Hx    Social History   Socioeconomic History   Marital status: Married    Spouse name: Not on file   Number of  children: Not on file   Years of education: Not on file   Highest education level: Not on file  Occupational History   Occupation: works Haematologist  Tobacco Use   Smoking status: Never   Smokeless tobacco: Current    Types: Nurse, children's Use: Never used  Substance and Sexual Activity   Alcohol use: Not Currently    Alcohol/week: 15.0 standard drinks    Types: 15 Standard drinks or equivalent per week    Comment: occasionally 1-2 drinks at night after work   Drug use: Not Currently    Types: Marijuana    Comment: 2-3 times a week occasionally(he states actually very rare)   Sexual activity: Yes  Other Topics Concern   Not on file  Social History Narrative   Not on file   Social Determinants of Health   Financial Resource Strain: Not on file  Food Insecurity: Not on file  Transportation Needs: Not on file  Physical Activity: Not on file  Stress: Not on file  Social Connections: Not on file  Intimate Partner Violence: Not on file    Physical Exam: Today's Vitals   03/17/21 1254 03/17/21 1255 03/17/21 1322  BP: (!) 160/100  (!) 151/102  Pulse: 69  67  Resp:   11  Temp: 98.6 F (37 C) 98.6 F (37 C)   TempSrc: Temporal    SpO2: 97%  98%  Weight: 220 lb (99.8 kg)    Height: 6\' 1"  (1.854 m)     Body mass index is 29.03 kg/m. GEN: NAD EYE: Sclerae anicteric ENT: MMM CV: Non-tachycardic GI: Soft, NT/ND NEURO:  Alert & Oriented x 3  Lab Results: No results for input(s): WBC, HGB, HCT, PLT in the last 72 hours. BMET No results for input(s): NA, K, CL, CO2, GLUCOSE, BUN, CREATININE, CALCIUM in the last 72 hours. LFT No results for input(s): PROT, ALBUMIN, AST, ALT, ALKPHOS, BILITOT, BILIDIR, IBILI in the last 72 hours. PT/INR No results for input(s): LABPROT, INR in the last 72 hours.   Impression / Plan: This is a 45 y.o.male who presents for Colonoscopy for screening.  The risks and benefits of endoscopic evaluation/treatment were  discussed with the patient and/or family; these include but are not limited to the risk of perforation, infection, bleeding, missed lesions, lack of diagnosis, severe illness requiring hospitalization, as well as anesthesia and sedation related illnesses.  The patient's history has been reviewed, patient examined, no change in status, and deemed stable for procedure.  The patient and/or family is agreeable to proceed.    Justice Britain, MD [facility] Advanced [facility]oscopy Office # [phone]

## 2021-03-17 NOTE — Op Note (Signed)
[facility] Patient Name: Dean Hughes Procedure Date: 03/17/2021 1:22 PM MRN: [id number] Endoscopist: Justice Britain , MD Age: 45 Referring MD:  Date of Birth: February 19, 1976 Gender: Male Account #: [id number] Procedure:                Colonoscopy Indications:              Screening for colorectal malignant neoplasm, This                            is the patient's first colonoscopy Medicines:                Monitored Anesthesia Care Procedure:                Pre-Anesthesia Assessment:                           - Prior to the procedure, a History and Physical                            was performed, and patient medications and                            allergies were reviewed. The patient's tolerance of                            previous anesthesia was also reviewed. The risks                            and benefits of the procedure and the sedation                            options and risks were discussed with the patient.                            All questions were answered, and informed consent                            was obtained. Prior Anticoagulants: The patient has                            taken no previous anticoagulant or antiplatelet                            agents. ASA Grade Assessment: II - A patient with                            mild systemic disease. After reviewing the risks                            and benefits, the patient was deemed in                            satisfactory condition to undergo the procedure.  After obtaining informed consent, the colonoscope                            was passed under direct vision. Throughout the                            procedure, the patient's blood pressure, pulse, and                            oxygen saturations were monitored continuously. The                            CF [id number] #[id number] was introduced through the anus                            and advanced to  the 5 cm into the ileum. The                            colonoscopy was performed without difficulty. The                            patient tolerated the procedure. The quality of the                            bowel preparation was good. The terminal ileum,                            ileocecal valve, appendiceal orifice, and rectum                            were photographed. Scope In: 1:27:39 PM Scope Out: 1:44:40 PM Scope Withdrawal Time: 0 hours 14 minutes 55 seconds  Total Procedure Duration: 0 hours 17 minutes 1 second  Findings:                 The digital rectal exam findings include                            hemorrhoids. Pertinent negatives include no                            palpable rectal lesions.                           The terminal ileum and ileocecal valve appeared                            normal.                           Four sessile polyps were found in the rectum (1),                            sigmoid colon (1), descending colon (1), and  transverse colon (1). The polyps were 3 to 6 mm in                            size. These polyps were removed with a cold snare.                            Resection and retrieval were complete.                           Normal mucosa was found in the entire colon                            otherwise.                           Non-bleeding non-thrombosed external and internal                            hemorrhoids were found during retroflexion, during                            perianal exam and during digital exam. The                            hemorrhoids were Grade II (internal hemorrhoids                            that prolapse but reduce spontaneously). Complications:            No immediate complications. Estimated Blood Loss:     Estimated blood loss was minimal. Impression:               - Hemorrhoids found on digital rectal exam.                           - The examined portion of the  ileum was normal.                           - Four, 3 to 6 mm polyps in the rectum, in the                            sigmoid colon, in the descending colon and in the                            transverse colon, removed with a cold snare.                            Resected and retrieved.                           - Normal mucosa in the entire examined colon                            otherwise.                           -  Non-bleeding non-thrombosed external and internal                            hemorrhoids. Recommendation:           - The patient will be observed post-procedure,                            until all discharge criteria are met.                           - Discharge patient to home.                           - Patient has a contact number available for                            emergencies. The signs and symptoms of potential                            delayed complications were discussed with the                            patient. Return to normal activities tomorrow.                            Written discharge instructions were provided to the                            patient.                           - High fiber diet.                           - Use FiberCon 1-2 tablets PO daily.                           - Continue present medications.                           - Await pathology results.                           - Repeat colonoscopy in 3/5/7 years for                            surveillance based on pathology results.                           - The findings and recommendations were discussed                            with the patient.                           - The findings and recommendations were discussed  with the patient's family. Justice Britain, MD 03/17/2021 1:50:11 PM

## 2021-03-17 NOTE — Progress Notes (Signed)
A and O x3. Report to RN. Tolerated MAC anesthesia well. 

## 2021-03-19 ENCOUNTER — Telehealth: Payer: Self-pay | Admitting: *Deleted

## 2021-03-19 IMAGING — DX DG LUMBAR SPINE COMPLETE 4+V
5 series · 5 of 5 positions shown · non-contrast
Comparison: None.

CLINICAL DATA: Low back pain for several days following heavy
lifting, initial encounter

EXAM:
LUMBAR SPINE - COMPLETE 4+ VIEW

[l-spine ap]
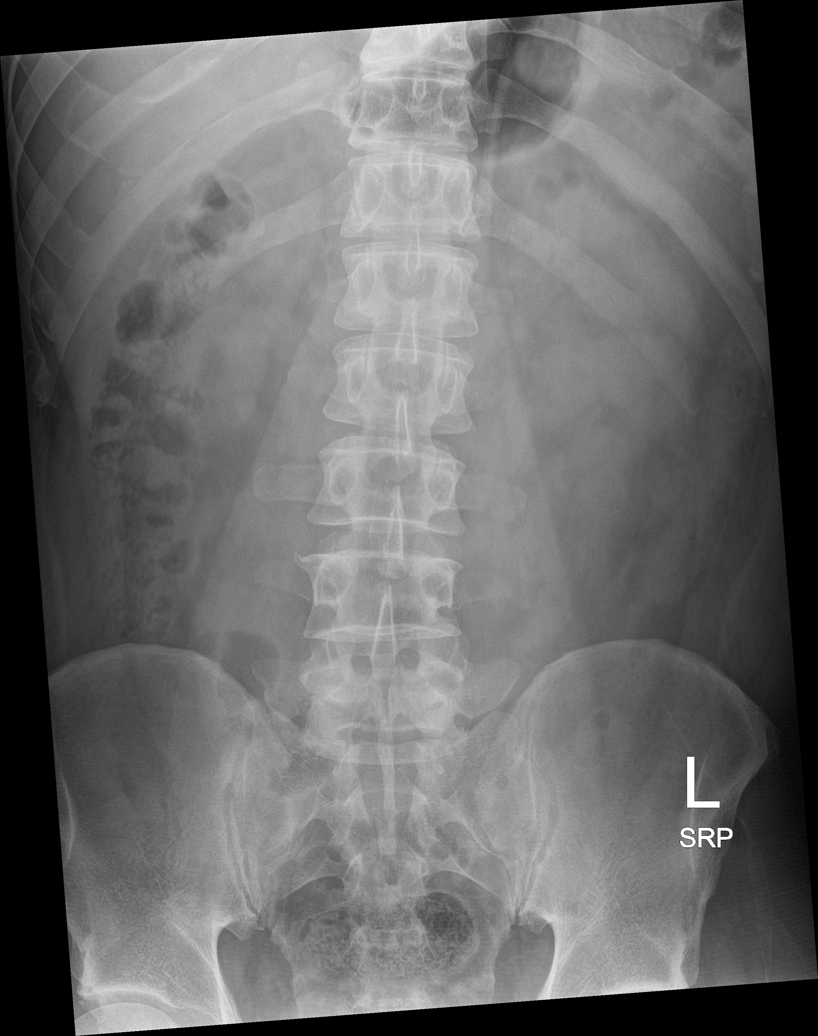

[l-spine obl (1 of 2)]
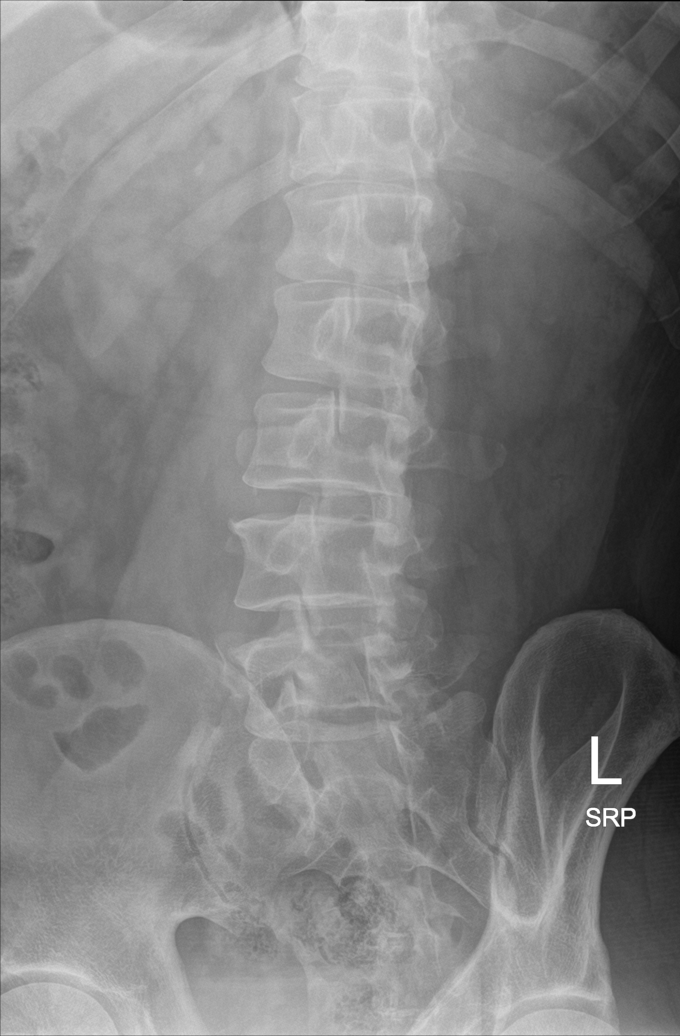

[l-spine obl (2 of 2)]
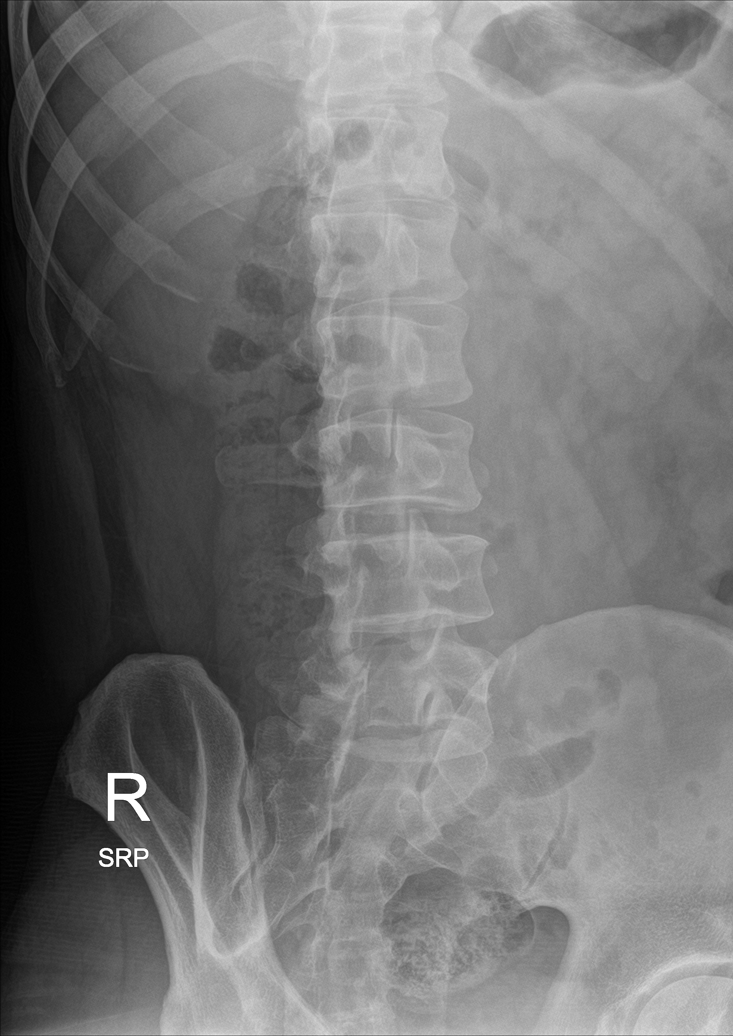

[l-spine lat]
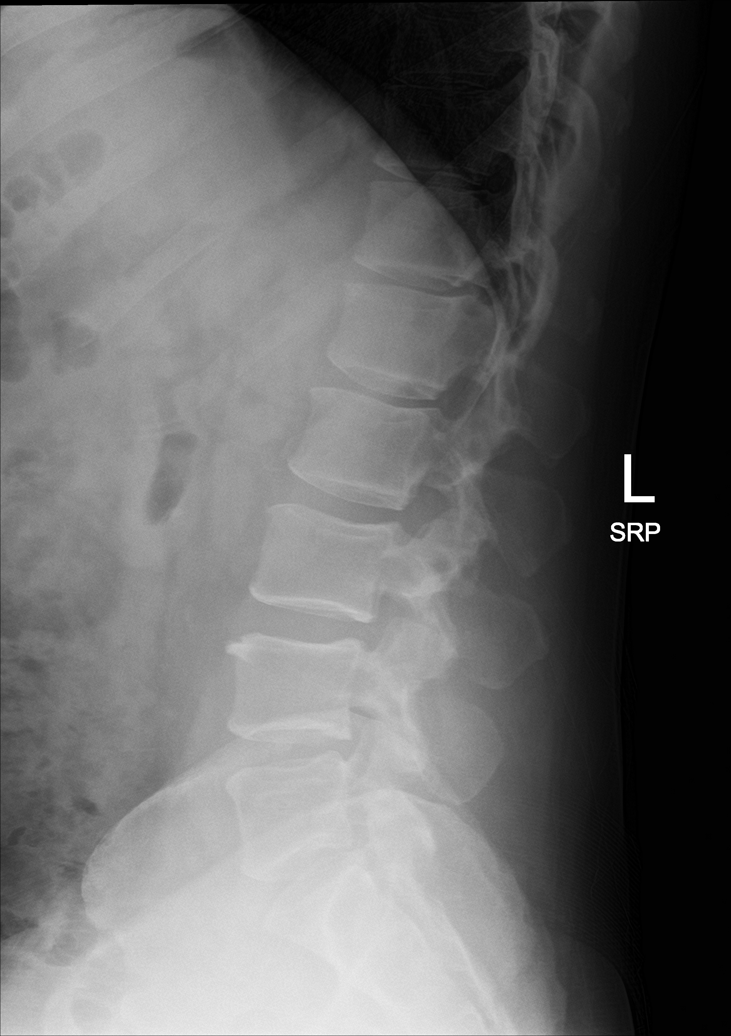

[l-spine spot]
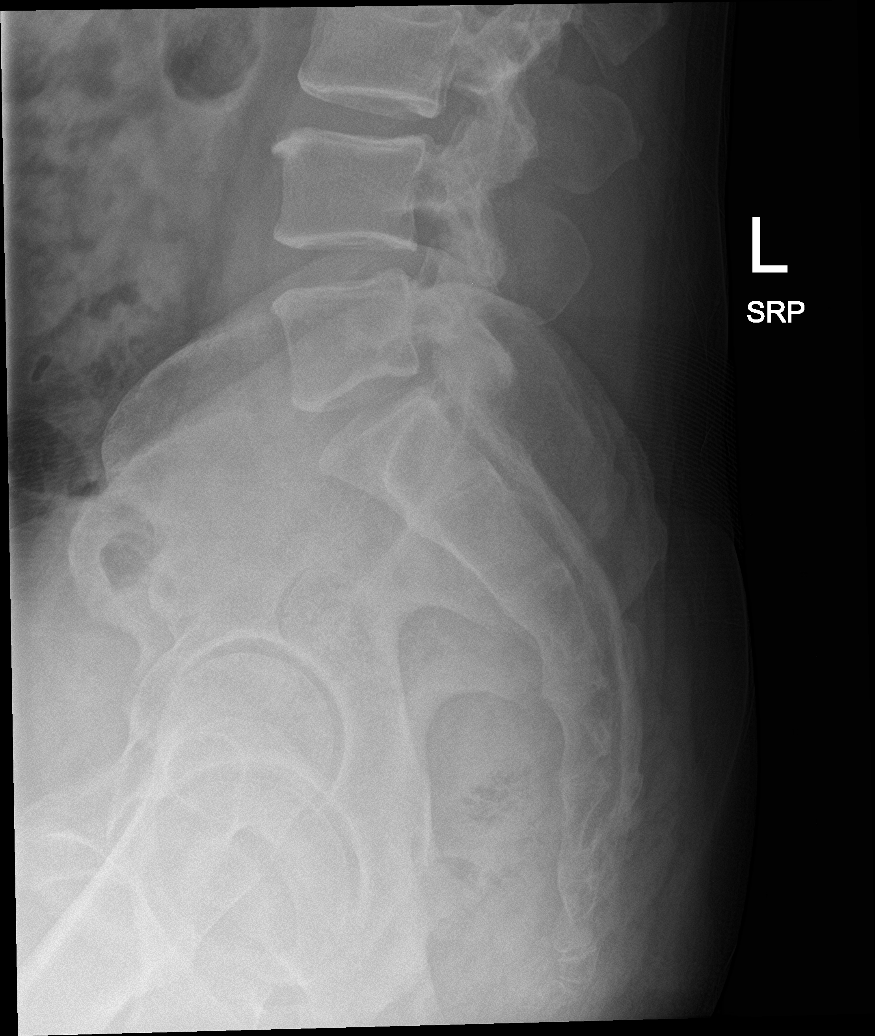

[5 of 5 positions shown; findings below may reference images not displayed]

FINDINGS: Five lumbar type vertebral bodies are visualized. Mild osteophytic
changes are seen. No pars defects are noted. No compression
deformity is noted. Mild disc space narrowing at L4-5 is noted. No
soft tissue abnormality is seen.
IMPRESSION: Mild degenerative change without acute abnormality.

## 2021-03-19 NOTE — Telephone Encounter (Signed)
  Follow up Call-  Call back number 03/17/2021  Post procedure Call Back phone  # [phone]  Permission to leave phone message Yes  Some recent data might be hidden     Patient questions:  Do you have a fever, pain , or abdominal swelling? No. Pain Score  0 *  Have you tolerated food without any problems? Yes.    Have you been able to return to your normal activities? Yes.    Do you have any questions about your discharge instructions: Diet   No. Medications  No. Follow up visit  No.  Do you have questions or concerns about your Care? No.  Actions: * If pain score is 4 or above: No action needed, pain <4.  Have you developed a fever since your procedure? no  2.   Have you had an respiratory symptoms (SOB or cough) since your procedure? no  3.   Have you tested positive for COVID 19 since your procedure no  4.   Have you had any family members/close contacts diagnosed with the COVID 19 since your procedure?  no   If yes to any of these questions please route to Joylene John, RN and Joella Prince, RN

## 2021-03-22 ENCOUNTER — Encounter: Payer: Self-pay | Admitting: Gastroenterology

## 2021-03-23 ENCOUNTER — Ambulatory Visit: Payer: No Typology Code available for payment source | Admitting: Cardiology

## 2021-03-23 ENCOUNTER — Other Ambulatory Visit: Payer: Self-pay

## 2021-03-23 ENCOUNTER — Other Ambulatory Visit (HOSPITAL_COMMUNITY): Payer: Self-pay

## 2021-03-23 ENCOUNTER — Encounter: Payer: Self-pay | Admitting: Cardiology

## 2021-03-23 VITALS — BP 158/104 | HR 68 | Ht 73.0 in | Wt 223.2 lb

## 2021-03-23 DIAGNOSIS — I480 Paroxysmal atrial fibrillation: Secondary | ICD-10-CM | POA: Diagnosis not present

## 2021-03-23 DIAGNOSIS — I10 Essential (primary) hypertension: Secondary | ICD-10-CM

## 2021-03-23 MED ORDER — METOPROLOL SUCCINATE ER 25 MG PO TB24
25.0000 mg | ORAL_TABLET | Freq: Every day | ORAL | 3 refills | Status: DC
  Filled 2021-03-23 – 2021-06-08 (×2): qty 90, 90d supply, fill #0
  Filled 2021-09-06: qty 90, 90d supply, fill #1
  Filled 2021-12-06: qty 90, 90d supply, fill #2
  Filled 2022-03-08: qty 90, 90d supply, fill #3

## 2021-03-23 NOTE — Addendum Note (Signed)
Addended by: Antonieta Iba on: 03/23/2021 09:19 AM   Modules accepted: Orders

## 2021-03-23 NOTE — Patient Instructions (Signed)
Please take your blood pressure twice daily for one week and send a MyChart message or call us with a list of your readings.   Medication Instructions:  Your physician recommends that you continue on your current medications as directed. Please refer to the Current Medication list given to you today.  *If you need a refill on your cardiac medications before your next appointment, please call your pharmacy*  Follow-Up: At [facility], you and your health needs are our priority.  As part of our continuing mission to provide you with exceptional heart care, we have created designated Provider Care Teams.  These Care Teams include your primary Cardiologist (physician) and Advanced Practice Providers (APPs -  Physician Assistants and Nurse Practitioners) who all work together to provide you with the care you need, when you need it.  Your next appointment:   1 year(s)  The format for your next appointment:   In Person  Provider:   You may see Fransico Him, MD or one of the following Advanced Practice Providers on your designated Care Team:   Melina Copa, PA-C Ermalinda Barrios, PA-C

## 2021-03-23 NOTE — Progress Notes (Signed)
Cardiology CONSULT Note    Date:  03/23/2021   ID:  Dean Hughes, DOB Dec 27, 1975, MRN [id number]  PCP:  Mackie Pai, PA-C  Cardiologist:  Fransico Him, MD   Chief Complaint  Patient presents with   New Patient (Initial Visit)    PAF and HTN     History of Present Illness:  Dean Hughes is a 45 y.o. male who is being seen today for the evaluation of PAF at the request of Saguier, Percell Miller, [place].  This is a 45yo male with a hx of PAF (occurred once), GERD, HTN who is referred to establish cardiac care for his afib.  He was dx with PAF in 2017 and has been on low dose BB since then. He was lost to followup.  Echo at that time was normal.  He is here today for followup and is doing well.  He denies any chest pain or pressure, SOB, DOE, PND, orthopnea, LE edema, dizziness, palpitations or syncope. He is more aware of his heart beat when he lays down at night to go to sleep.  He is compliant with his meds and is tolerating meds with no SE.   He rides his bike for exercise with no problems and rides 3-4 miles each time.    Past Medical History:  Diagnosis Date   GERD (gastroesophageal reflux disease)    with certain foods   Hypertension    on meds   PAF (paroxysmal atrial fibrillation) ([facility]) 01/19/2016   one time incident   Panic attacks    situational   Seasonal allergies     Past Surgical History:  Procedure Laterality Date   NO PAST SURGERIES      Current Medications: Current Meds  Medication Sig   GARLIC PO Take 1 tablet by mouth daily at 6 (six) AM.   metoprolol succinate (TOPROL-XL) 25 MG 24 hr tablet TAKE 1 TABLET BY MOUTH DAILY *NEEDS OFFICE VISIT   Omega-3 Fatty Acids (FISH OIL PO) Take 1 capsule by mouth every other day.    Allergies:   Patient has no known allergies.   Social History   Socioeconomic History   Marital status: Married    Spouse name: Not on file   Number of children: Not on file   Years of education: Not on file   Highest  education level: Not on file  Occupational History   Occupation: works Haematologist  Tobacco Use   Smoking status: Never   Smokeless tobacco: Current    Types: Nurse, children's Use: Never used  Substance and Sexual Activity   Alcohol use: Not Currently    Alcohol/week: 15.0 standard drinks    Types: 15 Standard drinks or equivalent per week    Comment: occasionally 1-2 drinks at night after work   Drug use: Not Currently    Types: Marijuana    Comment: 2-3 times a week occasionally(he states actually very rare)   Sexual activity: Yes  Other Topics Concern   Not on file  Social History Narrative   Not on file   Social Determinants of Health   Financial Resource Strain: Not on file  Food Insecurity: Not on file  Transportation Needs: Not on file  Physical Activity: Not on file  Stress: Not on file  Social Connections: Not on file     Family History:  The patient's family history includes Colitis in his mother; Crohn's disease in his maternal uncle and another family member; Hypertension  in his mother.   ROS:   Please see the history of present illness.    ROS All other systems reviewed and are negative.  No flowsheet data found.     PHYSICAL EXAM:   VS:  BP (!) 158/104   Pulse 68   Ht 6\' 1"  (1.854 m)   Wt 223 lb 3.2 oz (101.2 kg)   SpO2 99%   BMI 29.45 kg/m    GEN: Well nourished, well developed, in no acute distress  HEENT: normal  Neck: no JVD, carotid bruits, or masses Cardiac: RRR; no murmurs, rubs, or gallops,no edema.  Intact distal pulses bilaterally.  Respiratory:  clear to auscultation bilaterally, normal work of breathing GI: soft, nontender, nondistended, + BS MS: no deformity or atrophy  Skin: warm and dry, no rash Neuro:  Alert and Oriented x 3, Strength and sensation are intact Psych: euthymic mood, full affect  Wt Readings from Last 3 Encounters:  03/23/21 223 lb 3.2 oz (101.2 kg)  03/17/21 220 lb (99.8 kg)  02/23/21 220 lb  (99.8 kg)      Studies/Labs Reviewed:   EKG:  EKG is ordered today.  The ekg ordered today demonstrates ectopic atrial rhythm with no ST changes  Recent Labs: 01/05/2021: ALT 23; BUN 15; Creatinine, Ser 1.00; Hemoglobin 15.1; Platelets 173.0; Potassium 4.6; Sodium 138; TSH 1.25   Lipid Panel    Component Value Date/Time   CHOL 183 01/05/2021 0933   TRIG 128.0 01/05/2021 0933   HDL 37.50 (L) 01/05/2021 0933   CHOLHDL 5 01/05/2021 0933   VLDL 25.6 01/05/2021 0933   LDLCALC 120 (H) 01/05/2021 0933     CHA2DS2-VASc Score = 1  This indicates a 0.6% annual risk of stroke. The patient's score is based upon: CHF History: 0 HTN History: 1 Diabetes History: 0 Stroke History: 0 Vascular Disease History: 0 Age Score: 0 Gender Score: 0   Additional studies/ records that were reviewed today include:  OV notes from PCP  ASSESSMENT:    1. PAF (paroxysmal atrial fibrillation) ([facility])   2. Benign essential HTN      PLAN:  In order of problems listed above:  PAF -this was an isolated event in 2017 and he has done well since -his CHADS2VASC score is 1 (HTN) and therefore not on longterm anticoagulation -he has not had any palpitations -continue Toprol XL 25mg  daily>refilled -he did not tolerate higher dose of BB  2.  HTN -BP poorly controlled on exam today -he did not tolerate a higher dose of Toprol in the past so will keep him on 25mg  daily>>he tells me that his BP seems to be higher at the doctors office -check BP twice daily for 1 week  Time Spent: 20 minutes total time of encounter, including 15 minutes spent in face-to-face patient care on the date of this encounter. This time includes coordination of care and counseling regarding above mentioned problem list. Remainder of non-face-to-face time involved reviewing chart documents/testing relevant to the patient encounter and documentation in the medical record. I have independently reviewed documentation from referring  provider  Medication Adjustments/Labs and Tests Ordered: Current medicines are reviewed at length with the patient today.  Concerns regarding medicines are outlined above.  Medication changes, Labs and Tests ordered today are listed in the Patient Instructions below.  There are no Patient Instructions on file for this visit.   Signed, Fransico Him, MD  03/23/2021 9:10 AM    [address],  [place], [place]  [address] Phone: [phone]; Fax: [phone]

## 2021-04-07 ENCOUNTER — Telehealth: Payer: Self-pay | Admitting: Cardiology

## 2021-04-07 NOTE — Telephone Encounter (Signed)
Patient called to give BP readings back to Dr. Radford Pax and nurse:  138/88 133/82 135/85 140/90 132/87 136/84 128/82 130/82 124/80 134/86

## 2021-04-07 NOTE — Telephone Encounter (Signed)
Novak, Stgermaine - 04/07/2021  2:57 PM Sueanne Margarita, MD  Sent: Wed April 07, 2021  3:21 PM  To: Nuala Alpha, LPN          Message  BPs for the most part look good continue current therapy   Pt made aware that per Dr. Radford Pax, his BPs for the most part look good and she would like for him to continue his current therapy.  Pt verbalized understanding and agrees with this plan.

## 2021-05-25 ENCOUNTER — Ambulatory Visit (INDEPENDENT_AMBULATORY_CARE_PROVIDER_SITE_OTHER): Payer: No Typology Code available for payment source | Admitting: Family

## 2021-05-25 ENCOUNTER — Encounter: Payer: Self-pay | Admitting: Family

## 2021-05-25 VITALS — BP 150/100 | HR 90 | Temp 98.6°F | Ht 73.0 in | Wt 222.0 lb

## 2021-05-25 DIAGNOSIS — I10 Essential (primary) hypertension: Secondary | ICD-10-CM | POA: Diagnosis not present

## 2021-05-25 DIAGNOSIS — N200 Calculus of kidney: Secondary | ICD-10-CM

## 2021-05-25 DIAGNOSIS — R339 Retention of urine, unspecified: Secondary | ICD-10-CM

## 2021-05-25 NOTE — Progress Notes (Signed)
Dean Hughes is a 45 y.o. male with the following history as recorded in Epic[facility]:  Patient Active Problem List   Diagnosis Date Noted   Benign essential HTN 03/23/2021   PAF (paroxysmal atrial fibrillation) ([facility]) 01/19/2016    Current Outpatient Medications  Medication Sig Dispense Refill   GARLIC PO Take 1 tablet by mouth daily at 6 (six) AM.     metoprolol succinate (TOPROL-XL) 25 MG 24 hr tablet Take 1 tablet (25 mg total) by mouth daily. 90 tablet 3   Omega-3 Fatty Acids (FISH OIL PO) Take 1 capsule by mouth every other day.     No current facility-administered medications for this visit.    Allergies: Patient has no known allergies.  Past Medical History:  Diagnosis Date   GERD (gastroesophageal reflux disease)    with certain foods   Hypertension    on meds   PAF (paroxysmal atrial fibrillation) ([place]) 01/19/2016   one time incident   Panic attacks    situational   Seasonal allergies     Past Surgical History:  Procedure Laterality Date   NO PAST SURGERIES      Family History  Problem Relation Age of Onset   Colitis Mother    Hypertension Mother    Crohn's disease Maternal Uncle    Crohn's disease Other    Colon polyps Neg Hx    Colon cancer Neg Hx    Esophageal cancer Neg Hx    Rectal cancer Neg Hx    Stomach cancer Neg Hx     Social History   Tobacco Use   Smoking status: Never   Smokeless tobacco: Current    Types: Chew  Substance Use Topics   Alcohol use: Not Currently    Alcohol/week: 15.0 standard drinks    Types: 15 Standard drinks or equivalent per week    Comment: occasionally 1-2 drinks at night after work    Subjective:   Presents with concerns for possible kidney stone; notes that he passed stone on his own; notes he has a history of kidney stones but has not previously been under the care of urology; denies any blood in his urine; has been having some chronic pelvic discomfort for the past month; still complaining of sensation of  mild discomfort with urination for the past 1-2 weeks- concerned he may have infection;     Objective:  Vitals:   05/25/21 1359 05/25/21 1427  BP: (!) 160/100 (!) 150/100  Pulse: 90   Temp: 98.6 F (37 C)   TempSrc: Oral   SpO2: 98%   Weight: 222 lb (100.7 kg)   Height: 6' 1"  (1.854 m)     General: Well developed, well nourished, in no acute distress  Skin : Warm and dry.  Head: Normocephalic and atraumatic  Lungs: Respirations unlabored; clear to auscultation bilaterally without wheeze, rales, rhonchi  CVS exam: normal rate and regular rhythm.  Neurologic: Alert and oriented; speech intact; face symmetrical; moves all extremities well; CNII-XII intact without focal deficit   Assessment:  1. Incomplete emptying of bladder   2. Nephrolithiasis   3. Benign essential HTN     Plan:  Patient brings in stone that he was able to pass on his own; due to the size of the stone, recommend renal stone CT to evaluate if other stones are present; check U/A with microscopy today; will need to consider referral to urology; 3.   Discussed concerns about blood pressure- ? If white coat HTN vs medication adjustment needs  to occur; patient notes he is hesitant to check regularly as he feels the stress associated with this could falsely elevate his blood pressure; acknowledged his concern but did ask him to consider to start checking and plan to see his PCP for follow up in January 2023;   This visit occurred during the SARS-CoV-2 public health emergency.  Safety protocols were in place, including screening questions prior to the visit, additional usage of staff PPE, and extensive cleaning of exam room while observing appropriate contact time as indicated for disinfecting solutions.    No follow-ups on file.  Orders Placed This Encounter  Procedures   CT RENAL STONE STUDY    Standing Status:   Future    Standing Expiration Date:   05/25/2022    Order Specific Question:   Preferred imaging  location?    Answer:   [facility]   Comp Met ([facility])   Urinalysis, Routine w reflex microscopic    Requested Prescriptions    No prescriptions requested or ordered in this encounter

## 2021-05-25 NOTE — Patient Instructions (Signed)
I appreciate you are concerned about being stressed out having to check your blood pressure regularly. I would recommend that you start checking your blood pressure at least once a day for the next 2-3 weeks and then schedule a follow up with your PCP to discuss and review options.   Today, we will check a Urine and labs to determine how to follow up on the kidney stone. We may need to have you see a urologist.

## 2021-05-26 LAB — URINALYSIS, ROUTINE W REFLEX MICROSCOPIC
Bilirubin Urine: NEGATIVE
Hgb urine dipstick: NEGATIVE
Ketones, ur: NEGATIVE
Leukocytes,Ua: NEGATIVE
Nitrite: NEGATIVE
RBC / HPF: NONE SEEN (ref 0–?)
Specific Gravity, Urine: <=1.005 — AB (ref 1.000–1.030)
Total Protein, Urine: NEGATIVE
Urine Glucose: NEGATIVE
Urobilinogen, UA: 0.2 (ref 0.0–1.0)
WBC, UA: NONE SEEN (ref 0–?)
pH: 6.5 (ref 5.0–8.0)

## 2021-05-26 LAB — COMPREHENSIVE METABOLIC PANEL
ALT: 25 U/L (ref 0–53)
AST: 20 U/L (ref 0–37)
Albumin: 4.6 g/dL (ref 3.5–5.2)
Alkaline Phosphatase: 84 U/L (ref 39–117)
BUN: 12 mg/dL (ref 6–23)
CO2: 29 mEq/L (ref 19–32)
Calcium: 9.8 mg/dL (ref 8.4–10.5)
Chloride: 100 mEq/L (ref 96–112)
Creatinine, Ser: 1.08 mg/dL (ref 0.40–1.50)
GFR: 82.73 mL/min (ref >60.00)
Glucose, Bld: 84 mg/dL (ref 70–99)
Potassium: 4.1 mEq/L (ref 3.5–5.1)
Sodium: 138 mEq/L (ref 135–145)
Total Bilirubin: 1.1 mg/dL (ref 0.2–1.2)
Total Protein: 7.5 g/dL (ref 6.0–8.3)

## 2021-05-27 ENCOUNTER — Telehealth: Payer: Self-pay

## 2021-05-27 NOTE — Telephone Encounter (Signed)
Patient called again , made him aware Dean Hughes had not reviewed them yet , but will be no later than Friday or Monday on result note

## 2021-05-27 NOTE — Telephone Encounter (Signed)
Caller is following up about his lab results.  Telephone: [phone]

## 2021-05-31 ENCOUNTER — Ambulatory Visit: Payer: No Typology Code available for payment source | Admitting: Family Medicine

## 2021-06-08 ENCOUNTER — Ambulatory Visit (INDEPENDENT_AMBULATORY_CARE_PROVIDER_SITE_OTHER): Payer: No Typology Code available for payment source | Admitting: Medical

## 2021-06-08 ENCOUNTER — Other Ambulatory Visit (HOSPITAL_COMMUNITY): Payer: Self-pay

## 2021-06-08 ENCOUNTER — Encounter: Payer: Self-pay | Admitting: Medical

## 2021-06-08 VITALS — BP 145/83 | HR 86 | Temp 98.2°F | Resp 18 | Ht 73.0 in | Wt 223.8 lb

## 2021-06-08 DIAGNOSIS — R0981 Nasal congestion: Secondary | ICD-10-CM

## 2021-06-08 DIAGNOSIS — J40 Bronchitis, not specified as acute or chronic: Secondary | ICD-10-CM | POA: Diagnosis not present

## 2021-06-08 DIAGNOSIS — I10 Essential (primary) hypertension: Secondary | ICD-10-CM

## 2021-06-08 DIAGNOSIS — N200 Calculus of kidney: Secondary | ICD-10-CM

## 2021-06-08 MED ORDER — AZITHROMYCIN 250 MG PO TABS
ORAL_TABLET | ORAL | 0 refills | Status: AC
  Filled 2021-06-08: qty 6, 5d supply, fill #0

## 2021-06-08 MED ORDER — FLUTICASONE PROPIONATE 50 MCG/ACT NA SUSP
2.0000 | Freq: Every day | NASAL | 1 refills | Status: DC
  Filled 2021-06-08: qty 16, 30d supply, fill #0

## 2021-06-08 NOTE — Patient Instructions (Addendum)
Htn- you blood pressure is high at times during the day and you also do tend to have some white coat component. Your bp level in am are very tightly controlled  early morning  with  briefly high levels late in afternoon.  Would ask that you give me bp update in one week on blood pressure checks so can decide on either doubling up on metoprolol to 50 mg daily or adding on low dose losartan 25 mg daily to your metoprolol 25 mg.   Kidney stone recently. No blood in urine and no renal colic type pain. You have renal stone study scheduled in January. You can follow thru with this as would be beneficial to know if other stones present?   Also for  nasal congestion  one week and thick mucus production when blowing nose as well as chest congestion, I decided to prescribe azithromycin and flonase. Concern for bronchitis and sinus infection.  Follow up one week or sooner if needed.

## 2021-06-08 NOTE — Progress Notes (Signed)
Subjective:    Patient ID: Dean Hughes, male    DOB: 05-13-1976, 45 y.o.   MRN: [id number]  HPI  Pt in for htn. Pt states in past some higher bp levels initially when checked. Today bp is again high initially. On last visit pt bp was initially high. 150/100. Pt states for a while he was checking his bp at home 3 times a day. His bp was in 120-130/70-80. He was checking it one week ago. At most he would see 140/90 when arrives from work. Then lower later in the evening.  Pt is only on metoprolol xl 25 mg daily. At one point was on lower dose diuretic. It causes cramping.   Pt has history of kidney stones. 2 weeks ago urine showed no blood. No renal colic type pain. Pt has ct renal stone scheduled for incomplete emptying of bladder sensation on last visit. That has subsided. Pt thinks he may not go thru with repeat ct.  Pt shows me the stone he passes and appeared about 6 mm approximate size? Pt never was evaluated prior to passing the stone.  Nasal congestion for one week.Last Sunday on week ago is when it started. Pt did check covid over the counter test and was negative. Never had fever, chills or sweats.  When he blows nose gets some yellow colored mucus with blood tinge. Other times clear.    Review of Systems  Constitutional:  Negative for chills, fatigue and fever.  HENT:  Positive for congestion and sinus pressure. Negative for sinus pain, sore throat and trouble swallowing.   Respiratory:  Positive for cough. Negative for chest tightness, shortness of breath and wheezing.        Productive cough at times.  Cardiovascular:  Negative for chest pain and palpitations.  Gastrointestinal:  Negative for abdominal pain, blood in stool, constipation, diarrhea and nausea.  Genitourinary:  Negative for dysuria, flank pain, frequency, penile discharge, penile pain and penile swelling.  Musculoskeletal:  Negative for back pain, myalgias and neck stiffness.  Skin:  Negative for rash.   Neurological:  Negative for facial asymmetry, speech difficulty, weakness, light-headedness and numbness.  Hematological:  Negative for adenopathy. Does not bruise/bleed easily.  Psychiatric/Behavioral:  Negative for behavioral problems, dysphoric mood and sleep disturbance. The patient is not nervous/anxious.    Past Medical History:  Diagnosis Date   GERD (gastroesophageal reflux disease)    with certain foods   Hypertension    on meds   PAF (paroxysmal atrial fibrillation) ([facility]) 01/19/2016   one time incident   Panic attacks    situational   Seasonal allergies      Social History   Socioeconomic History   Marital status: Married    Spouse name: Not on file   Number of children: Not on file   Years of education: Not on file   Highest education level: Not on file  Occupational History   Occupation: works Haematologist  Tobacco Use   Smoking status: Never   Smokeless tobacco: Current    Types: Nurse, children's Use: Never used  Substance and Sexual Activity   Alcohol use: Not Currently    Alcohol/week: 15.0 standard drinks    Types: 15 Standard drinks or equivalent per week    Comment: occasionally 1-2 drinks at night after work   Drug use: Not Currently    Types: Marijuana    Comment: 2-3 times a week occasionally(he states actually very rare)   Sexual  activity: Yes  Other Topics Concern   Not on file  Social History Narrative   Not on file   Social Determinants of Health   Financial Resource Strain: Not on file  Food Insecurity: Not on file  Transportation Needs: Not on file  Physical Activity: Not on file  Stress: Not on file  Social Connections: Not on file  Intimate Partner Violence: Not on file    Past Surgical History:  Procedure Laterality Date   NO PAST SURGERIES      Family History  Problem Relation Age of Onset   Colitis Mother    Hypertension Mother    Crohn's disease Maternal Uncle    Crohn's disease Other    Colon polyps  Neg Hx    Colon cancer Neg Hx    Esophageal cancer Neg Hx    Rectal cancer Neg Hx    Stomach cancer Neg Hx     No Known Allergies  Current Outpatient Medications on File Prior to Visit  Medication Sig Dispense Refill   GARLIC PO Take 1 tablet by mouth daily at 6 (six) AM.     metoprolol succinate (TOPROL-XL) 25 MG 24 hr tablet Take 1 tablet (25 mg total) by mouth daily. 90 tablet 3   Omega-3 Fatty Acids (FISH OIL PO) Take 1 capsule by mouth every other day.     No current facility-administered medications on file prior to visit.    BP (!) 148/90    Pulse 86    Temp 98.2 F (36.8 C)    Resp 18    Ht 6\' 1"  (1.854 m)    Wt 223 lb 12.8 oz (101.5 kg)    SpO2 99%    BMI 29.53 kg/m       Objective:   Physical Exam  General Mental Status- Alert. General Appearance- Not in acute distress.   Skin General: Color- Normal Color. Moisture- Normal Moisture.  Neck Carotid Arteries- Normal color. Moisture- Normal Moisture. No carotid bruits. No JVD.  Chest and Lung Exam Auscultation: Breath Sounds:-Normal.  Cardiovascular Auscultation:Rythm- Regular. Murmurs & Other Heart Sounds:Auscultation of the heart reveals- No Murmurs.  Abdomen Inspection:-Inspeection Normal. Palpation/Percussion:Note:No mass. Palpation and Percussion of the abdomen reveal- Non Tender, Non Distended + BS, no rebound or guarding.   Neurologic Cranial Nerve exam:- CN III-XII intact(No nystagmus), symmetric smile. Strength:- 5/5 equal and symmetric strength both upper and lower extremities.       Assessment & Plan:   Patient Instructions  Htn- you blood pressure is high at times during the day and you also do tend to have some white coat component. Your bp level in am are very tightly controlled  early morning  with  briefly high levels late in afternoon.  Would ask that you give me bp update in one weekon blood pressure checks so can decide on either doubling up on metoprolol to 50 mg daily or adding on  low dose losartan 25 mg daily to your metoprolol 25 mg.  Kidney stone recently. No blood in urine and no renal colic type pain. You have renal stone study scheduled in January. You can follow thru with this as would be beneficial to know if other stones present?   Also for  nasal congestion  one week and thick mucus production when blowing nose as well as chest congestion, I decided to prescribe azithromycin and flonase. Concern for bronchitis and sinus infection.  Follow up one week or sooner if needed.     Time spent  with patient today was  40 minutes which consisted of chart review, discussing diagnosis, work up treatment and documentation. Answered various question on his bp med questions.

## 2021-06-21 ENCOUNTER — Other Ambulatory Visit: Payer: No Typology Code available for payment source

## 2021-09-06 ENCOUNTER — Other Ambulatory Visit (HOSPITAL_COMMUNITY): Payer: Self-pay

## 2021-12-06 ENCOUNTER — Other Ambulatory Visit (HOSPITAL_COMMUNITY): Payer: Self-pay

## 2022-03-08 ENCOUNTER — Other Ambulatory Visit (HOSPITAL_COMMUNITY): Payer: Self-pay

## 2022-06-01 ENCOUNTER — Other Ambulatory Visit: Payer: Self-pay

## 2022-06-01 ENCOUNTER — Other Ambulatory Visit (HOSPITAL_COMMUNITY): Payer: Self-pay

## 2022-06-01 ENCOUNTER — Other Ambulatory Visit: Payer: Self-pay | Admitting: Cardiology

## 2022-06-01 MED ORDER — METOPROLOL SUCCINATE ER 25 MG PO TB24
25.0000 mg | ORAL_TABLET | Freq: Every day | ORAL | 0 refills | Status: DC
Start: 2022-06-01 — End: 2022-06-01
  Filled 2022-06-01: qty 30, 30d supply, fill #0

## 2022-06-01 MED ORDER — METOPROLOL SUCCINATE ER 25 MG PO TB24
25.0000 mg | ORAL_TABLET | Freq: Every day | ORAL | 0 refills | Status: DC
  Filled 2022-06-01: qty 15, 15d supply, fill #0

## 2022-06-03 ENCOUNTER — Encounter: Payer: Self-pay | Admitting: Medical

## 2022-06-24 ENCOUNTER — Encounter: Payer: Self-pay | Admitting: Medical

## 2022-06-24 ENCOUNTER — Ambulatory Visit (INDEPENDENT_AMBULATORY_CARE_PROVIDER_SITE_OTHER): Payer: 59 | Admitting: Medical

## 2022-06-24 ENCOUNTER — Other Ambulatory Visit (HOSPITAL_COMMUNITY): Payer: Self-pay

## 2022-06-24 ENCOUNTER — Other Ambulatory Visit: Payer: Self-pay

## 2022-06-24 VITALS — BP 128/82 | HR 66 | Resp 18 | Ht 73.0 in | Wt 215.0 lb

## 2022-06-24 DIAGNOSIS — Z125 Encounter for screening for malignant neoplasm of prostate: Secondary | ICD-10-CM | POA: Diagnosis not present

## 2022-06-24 DIAGNOSIS — Z0000 Encounter for general adult medical examination without abnormal findings: Secondary | ICD-10-CM

## 2022-06-24 LAB — COMPREHENSIVE METABOLIC PANEL
ALT: 21 U/L (ref 0–53)
AST: 18 U/L (ref 0–37)
Albumin: 4.4 g/dL (ref 3.5–5.2)
Alkaline Phosphatase: 69 U/L (ref 39–117)
BUN: 13 mg/dL (ref 6–23)
CO2: 30 mEq/L (ref 19–32)
Calcium: 9.4 mg/dL (ref 8.4–10.5)
Chloride: 104 mEq/L (ref 96–112)
Creatinine, Ser: 1.07 mg/dL (ref 0.40–1.50)
GFR: 83.03 mL/min (ref >60.00)
Glucose, Bld: 88 mg/dL (ref 70–99)
Potassium: 4.3 mEq/L (ref 3.5–5.1)
Sodium: 141 mEq/L (ref 135–145)
Total Bilirubin: 0.8 mg/dL (ref 0.2–1.2)
Total Protein: 6.9 g/dL (ref 6.0–8.3)

## 2022-06-24 LAB — CBC WITH DIFFERENTIAL/PLATELET
Basophils Absolute: 0 10*3/uL (ref 0.0–0.1)
Basophils Relative: 0.4 % (ref 0.0–3.0)
Eosinophils Absolute: 0.2 10*3/uL (ref 0.0–0.7)
Eosinophils Relative: 2.7 % (ref 0.0–5.0)
HCT: 46.7 % (ref 39.0–52.0)
Hemoglobin: 15.3 g/dL (ref 13.0–17.0)
Lymphocytes Relative: 32.3 % (ref 12.0–46.0)
Lymphs Abs: 1.8 10*3/uL (ref 0.7–4.0)
MCHC: 32.7 g/dL (ref 30.0–36.0)
MCV: 84.8 fl (ref 78.0–100.0)
Monocytes Absolute: 0.5 10*3/uL (ref 0.1–1.0)
Monocytes Relative: 9.3 % (ref 3.0–12.0)
Neutro Abs: 3.2 10*3/uL (ref 1.4–7.7)
Neutrophils Relative %: 55.3 % (ref 43.0–77.0)
Platelets: 191 10*3/uL (ref 150.0–400.0)
RBC: 5.5 Mil/uL (ref 4.22–5.81)
RDW: 14 % (ref 11.5–15.5)
WBC: 5.7 10*3/uL (ref 4.0–10.5)

## 2022-06-24 LAB — LIPID PANEL
Cholesterol: 159 mg/dL (ref 0–200)
HDL: 33.1 mg/dL — ABNORMAL LOW (ref >39.00)
LDL Cholesterol: 112 mg/dL — ABNORMAL HIGH (ref 0–99)
NonHDL: 126.33
Total CHOL/HDL Ratio: 5
Triglycerides: 73 mg/dL (ref 0.0–149.0)
VLDL: 14.6 mg/dL (ref 0.0–40.0)

## 2022-06-24 LAB — PSA: PSA: 0.55 ng/mL (ref 0.10–4.00)

## 2022-06-24 MED ORDER — METOPROLOL SUCCINATE ER 25 MG PO TB24
25.0000 mg | ORAL_TABLET | Freq: Every day | ORAL | 1 refills | Status: DC
  Filled 2022-06-24: qty 30, 30d supply, fill #0

## 2022-06-24 MED ORDER — METOPROLOL SUCCINATE ER 25 MG PO TB24
25.0000 mg | ORAL_TABLET | Freq: Every day | ORAL | 3 refills | Status: DC
  Filled 2022-06-24: qty 90, 90d supply, fill #0
  Filled 2022-09-20: qty 90, 90d supply, fill #1
  Filled 2022-12-19: qty 90, 90d supply, fill #2
  Filled 2023-03-20: qty 90, 90d supply, fill #3

## 2022-06-24 NOTE — Patient Instructions (Addendum)
For you wellness exam today I have ordered cbc, cmp and lipid panel.  Screening psa.  Tdap declined.  Recommend exercise and healthy diet.  We will let you know lab results as they come in.  Follow up date appointment will be determined after lab review.    Refilled your toprol xl today 90 tabs with 3 refills. Please go ahead and get rescheduled with cardiologist as discussed.   Preventive Care 69-47 Years Old, Male Preventive care refers to lifestyle choices and visits with your health care provider that can promote health and wellness. Preventive care visits are also called wellness exams. What can I expect for my preventive care visit? Counseling During your preventive care visit, your health care provider may ask about your: Medical history, including: Past medical problems. Family medical history. Current health, including: Emotional well-being. Home life and relationship well-being. Sexual activity. Lifestyle, including: Alcohol, nicotine or tobacco, and drug use. Access to firearms. Diet, exercise, and sleep habits. Safety issues such as seatbelt and bike helmet use. Sunscreen use. Work and work Statistician. Physical exam Your health care provider will check your: Height and weight. These may be used to calculate your BMI (body mass index). BMI is a measurement that tells if you are at a healthy weight. Waist circumference. This measures the distance around your waistline. This measurement also tells if you are at a healthy weight and may help predict your risk of certain diseases, such as type 2 diabetes and high blood pressure. Heart rate and blood pressure. Body temperature. Skin for abnormal spots. What immunizations do I need?  Vaccines are usually given at various ages, according to a schedule. Your health care provider will recommend vaccines for you based on your age, medical history, and lifestyle or other factors, such as travel or where you work. What  tests do I need? Screening Your health care provider may recommend screening tests for certain conditions. This may include: Lipid and cholesterol levels. Diabetes screening. This is done by checking your blood sugar (glucose) after you have not eaten for a while (fasting). Hepatitis B test. Hepatitis C test. HIV (human immunodeficiency virus) test. STI (sexually transmitted infection) testing, if you are at risk. Lung cancer screening. Prostate cancer screening. Colorectal cancer screening. Talk with your health care provider about your test results, treatment options, and if necessary, the need for more tests. Follow these instructions at home: Eating and drinking  Eat a diet that includes fresh fruits and vegetables, whole grains, lean protein, and low-fat dairy products. Take vitamin and mineral supplements as recommended by your health care provider. Do not drink alcohol if your health care provider tells you not to drink. If you drink alcohol: Limit how much you have to 0-2 drinks a day. Know how much alcohol is in your drink. In the [place] one drink equals one 12 oz bottle of beer (355 mL), one 5 oz glass of wine (148 mL), or one 1 oz glass of hard liquor (44 mL). Lifestyle Brush your teeth every morning and night with fluoride toothpaste. Floss one time each day. Exercise for at least 30 minutes 5 or more days each week. Do not use any products that contain nicotine or tobacco. These products include cigarettes, chewing tobacco, and vaping devices, such as e-cigarettes. If you need help quitting, ask your health care provider. Do not use drugs. If you are sexually active, practice safe sex. Use a condom or other form of protection to prevent STIs. Take aspirin only as told  by your health care provider. Make sure that you understand how much to take and what form to take. Work with your health care provider to find out whether it is safe and beneficial for you to take aspirin  daily. Find healthy ways to manage stress, such as: Meditation, yoga, or listening to music. Journaling. Talking to a trusted person. Spending time with friends and family. Minimize exposure to UV radiation to reduce your risk of skin cancer. Safety Always wear your seat belt while driving or riding in a vehicle. Do not drive: If you have been drinking alcohol. Do not ride with someone who has been drinking. When you are tired or distracted. While texting. If you have been using any mind-altering substances or drugs. Wear a helmet and other protective equipment during sports activities. If you have firearms in your house, make sure you follow all gun safety procedures. What's next? Go to your health care provider once a year for an annual wellness visit. Ask your health care provider how often you should have your eyes and teeth checked. Stay up to date on all vaccines. This information is not intended to replace advice given to you by your health care provider. Make sure you discuss any questions you have with your health care provider. Document Revised: 11/25/2020 Document Reviewed: 11/25/2020 Elsevier Patient Education  [place].

## 2022-06-24 NOTE — Progress Notes (Signed)
Subjective:    Patient ID: Dean Hughes, male    DOB: 1976/04/28, 47 y.o.   MRN: [id number]  HPI  Pt in for wellness exam. Pt did not eat breakfast today.   Pt has been exercising. Very rare alcohol use. Overall diet healthy. One cup of coffee a day.   Up to date on colonoscopy.   Decleins tdap.  Pt bp this morning 128/82. He shows me picture of his reading this morning. His average is 130/80.   On review hx of PAF. 2 years ago saw Dr. Radford Pax.  Pt ttells me no overt palpitation/fibrillation sensations. He is aware needs to follow up cardiologist.   PAF -this was an isolated event in 2017 and he has done well since -his CHADS2VASC score is 1 (HTN) and therefore not on longterm anticoagulation -he has not had any palpitations -continue Toprol XL '25mg'$  daily>refilled -he did not tolerate higher dose of BB   2.  HTN -BP poorly controlled on exam today -he did not tolerate a higher dose of Toprol in the past so will keep him on '25mg'$  daily>>he tells me that his BP seems to be higher at the doctors office -check BP twice daily for 1 week   CHA2DS2-VASc Score = 1  This indicates a 0.6% annual risk of stroke. The patient's score is based upon: CHF History: 0 HTN History: 1 Diabetes History: 0 Stroke History: 0 Vascular Disease History: 0 Age Score: 0 Gender Score: 0         Review of Systems  Constitutional:  Negative for chills, fatigue and fever.  HENT:  Negative for congestion, drooling, ear discharge and ear pain.   Respiratory:  Negative for cough, chest tightness, shortness of breath and wheezing.   Cardiovascular:  Negative for chest pain and palpitations.  Gastrointestinal:  Negative for abdominal pain, blood in stool, constipation, diarrhea and rectal pain.  Genitourinary:  Negative for dysuria, frequency and penile pain.  Musculoskeletal:  Negative for back pain.  Neurological:  Negative for dizziness, seizures, syncope, weakness, numbness and  headaches.  Hematological:  Negative for adenopathy. Does not bruise/bleed easily.  Psychiatric/Behavioral:  Negative for behavioral problems and decreased concentration.    Past Medical History:  Diagnosis Date   GERD (gastroesophageal reflux disease)    with certain foods   Hypertension    on meds   PAF (paroxysmal atrial fibrillation) ([place]) 01/19/2016   one time incident   Panic attacks    situational   Seasonal allergies      Social History   Socioeconomic History   Marital status: Married    Spouse name: Not on file   Number of children: Not on file   Years of education: Not on file   Highest education level: Not on file  Occupational History   Occupation: works Haematologist  Tobacco Use   Smoking status: Never   Smokeless tobacco: Current    Types: Nurse, children's Use: Never used  Substance and Sexual Activity   Alcohol use: Not Currently    Alcohol/week: 15.0 standard drinks of alcohol    Types: 15 Standard drinks or equivalent per week    Comment: occasionally 1-2 drinks at night after work   Drug use: Not Currently    Types: Marijuana    Comment: 2-3 times a week occasionally(he states actually very rare)   Sexual activity: Yes  Other Topics Concern   Not on file  Social History Narrative   Not on  file   Social Determinants of Health   Financial Resource Strain: Not on file  Food Insecurity: Not on file  Transportation Needs: Not on file  Physical Activity: Not on file  Stress: Not on file  Social Connections: Not on file  Intimate Partner Violence: Not on file    Past Surgical History:  Procedure Laterality Date   NO PAST SURGERIES      Family History  Problem Relation Age of Onset   Colitis Mother    Hypertension Mother    Crohn's disease Maternal Uncle    Crohn's disease Other    Colon polyps Neg Hx    Colon cancer Neg Hx    Esophageal cancer Neg Hx    Rectal cancer Neg Hx    Stomach cancer Neg Hx     No Known  Allergies  Current Outpatient Medications on File Prior to Visit  Medication Sig Dispense Refill   metoprolol succinate (TOPROL-XL) 25 MG 24 hr tablet Take 1 tablet (25 mg total) by mouth daily. Please call [phone] to schedule an overdue appointment for future refills. Thank you. 2nd attempt. 15 tablet 0   Omega-3 Fatty Acids (FISH OIL PO) Take 1 capsule by mouth every other day.     No current facility-administered medications on file prior to visit.    BP 128/82   Pulse 66   Resp 18   Ht '6\' 1"'$  (1.854 m)   Wt 215 lb (97.5 kg)   SpO2 100%   BMI 28.37 kg/m          Objective:   Physical Exam  General Mental Status- Alert. General Appearance- Not in acute distress.   Skin General: Color- Normal Color. Moisture- Normal Moisture.  Neck Carotid Arteries- Normal color. Moisture- Normal Moisture. No carotid bruits. No JVD.  Chest and Lung Exam Auscultation: Breath Sounds:-Normal.  Cardiovascular Auscultation:Rythm- Regular. Murmurs & Other Heart Sounds:Auscultation of the heart reveals- No Murmurs.  Abdomen Inspection:-Inspeection Normal. Palpation/Percussion:Note:No mass. Palpation and Percussion of the abdomen reveal- Non Tender, Non Distended + BS, no rebound or guarding.   Neurologic Cranial Nerve exam:- CN III-XII intact(No nystagmus), symmetric smile. Strength:- 5/5 equal and symmetric strength both upper and lower extremities.       Assessment & Plan:  For you wellness exam today I have ordered cbc, cmp and lipid panel.  Screening psa.  Tdap declined.  Recommend exercise and healthy diet.  We will let you know lab results as they come in.  Follow up date appointment will be determined after lab review.    Refilled your toprol xl today 90 tabs with 3 refills. Please go ahead and get rescheduled with cardiologist as discussed.  Mackie Pai, PA-C

## 2022-09-15 DIAGNOSIS — L821 Other seborrheic keratosis: Secondary | ICD-10-CM | POA: Diagnosis not present

## 2022-09-15 DIAGNOSIS — L820 Inflamed seborrheic keratosis: Secondary | ICD-10-CM | POA: Diagnosis not present

## 2022-09-20 ENCOUNTER — Other Ambulatory Visit (HOSPITAL_COMMUNITY): Payer: Self-pay

## 2022-10-04 DIAGNOSIS — L820 Inflamed seborrheic keratosis: Secondary | ICD-10-CM | POA: Diagnosis not present

## 2022-10-25 DIAGNOSIS — L820 Inflamed seborrheic keratosis: Secondary | ICD-10-CM | POA: Diagnosis not present

## 2022-10-25 DIAGNOSIS — L810 Postinflammatory hyperpigmentation: Secondary | ICD-10-CM | POA: Diagnosis not present

## 2022-12-19 ENCOUNTER — Other Ambulatory Visit (HOSPITAL_COMMUNITY): Payer: Self-pay

## 2023-03-20 ENCOUNTER — Other Ambulatory Visit (HOSPITAL_COMMUNITY): Payer: Self-pay

## 2023-04-24 ENCOUNTER — Other Ambulatory Visit (HOSPITAL_COMMUNITY): Payer: Self-pay

## 2023-04-24 ENCOUNTER — Other Ambulatory Visit: Payer: Self-pay

## 2023-04-24 MED ORDER — AMOXICILLIN 500 MG PO CAPS
ORAL_CAPSULE | ORAL | 1 refills | Status: DC
  Filled 2023-04-24: qty 21, 7d supply, fill #0
  Filled 2023-04-30 – 2023-05-02 (×2): qty 21, 7d supply, fill #1

## 2023-05-01 ENCOUNTER — Other Ambulatory Visit (HOSPITAL_COMMUNITY): Payer: Self-pay

## 2023-05-01 ENCOUNTER — Other Ambulatory Visit: Payer: Self-pay

## 2023-05-02 ENCOUNTER — Encounter: Payer: Self-pay | Admitting: Pharmacist

## 2023-05-02 ENCOUNTER — Other Ambulatory Visit: Payer: Self-pay

## 2023-05-02 ENCOUNTER — Other Ambulatory Visit (HOSPITAL_COMMUNITY): Payer: Self-pay

## 2023-06-12 ENCOUNTER — Other Ambulatory Visit: Payer: Self-pay | Admitting: Medical

## 2023-06-12 ENCOUNTER — Other Ambulatory Visit: Payer: Self-pay

## 2023-06-12 MED ORDER — METOPROLOL SUCCINATE ER 25 MG PO TB24
25.0000 mg | ORAL_TABLET | Freq: Every day | ORAL | 3 refills | Status: DC
  Filled 2023-06-12: qty 90, 90d supply, fill #0
  Filled 2023-09-11: qty 90, 90d supply, fill #1
  Filled 2023-12-12: qty 90, 90d supply, fill #2

## 2023-09-12 ENCOUNTER — Other Ambulatory Visit (HOSPITAL_COMMUNITY): Payer: Self-pay

## 2023-12-12 ENCOUNTER — Other Ambulatory Visit: Payer: Self-pay

## 2024-02-23 ENCOUNTER — Other Ambulatory Visit (HOSPITAL_COMMUNITY): Payer: Self-pay

## 2024-02-23 ENCOUNTER — Encounter: Payer: Self-pay | Admitting: Medical

## 2024-02-23 ENCOUNTER — Ambulatory Visit: Payer: Self-pay | Admitting: *Deleted

## 2024-02-23 ENCOUNTER — Other Ambulatory Visit: Payer: Self-pay

## 2024-02-23 ENCOUNTER — Ambulatory Visit: Admitting: Medical

## 2024-02-23 VITALS — BP 140/90 | HR 78 | Temp 97.7°F | Resp 16 | Ht 73.0 in | Wt 192.4 lb

## 2024-02-23 DIAGNOSIS — I10 Essential (primary) hypertension: Secondary | ICD-10-CM

## 2024-02-23 DIAGNOSIS — K649 Unspecified hemorrhoids: Secondary | ICD-10-CM

## 2024-02-23 MED ORDER — PRAMOXINE HCL (PERIANAL) 1 % EX FOAM
1.0000 | Freq: Three times a day (TID) | CUTANEOUS | 0 refills | Status: AC | PRN
  Filled 2024-02-23: qty 15, 5d supply, fill #0

## 2024-02-23 MED ORDER — METOPROLOL SUCCINATE ER 25 MG PO TB24
25.0000 mg | ORAL_TABLET | Freq: Every day | ORAL | 3 refills | Status: AC
  Filled 2024-02-23 – 2024-02-26 (×2): qty 90, 90d supply, fill #0
  Filled 2024-06-04: qty 90, 90d supply, fill #1

## 2024-02-23 MED ORDER — TRAMADOL HCL 50 MG PO TABS
50.0000 mg | ORAL_TABLET | Freq: Three times a day (TID) | ORAL | 0 refills | Status: DC | PRN
  Filled 2024-02-23: qty 15, 5d supply, fill #0

## 2024-02-23 MED ORDER — TRAMADOL HCL 50 MG PO TABS
50.0000 mg | ORAL_TABLET | Freq: Four times a day (QID) | ORAL | 0 refills | Status: AC | PRN
  Filled 2024-02-23 (×2): qty 12, 3d supply, fill #0

## 2024-02-23 MED ORDER — HYDROCORTISONE ACETATE 25 MG RE SUPP
25.0000 mg | Freq: Two times a day (BID) | RECTAL | 0 refills | Status: AC
  Filled 2024-02-23 (×2): qty 12, 6d supply, fill #0

## 2024-02-23 MED ORDER — HYDROCORT-PRAMOXINE (PERIANAL) 1-1 % EX FOAM
1.0000 | Freq: Four times a day (QID) | CUTANEOUS | 0 refills | Status: AC
  Filled 2024-02-23: qty 30, 28d supply, fill #0

## 2024-02-23 NOTE — Progress Notes (Signed)
 Subjective:    Patient ID: Dean Hughes, male    DOB: May 20, 1976, 48 y.o.   MRN: [id number]  HPI  Dean Hughes is a 48 year old male who presents with a thrombosed hemorrhoid that ruptured and bled.  He experienced a painful, protruding hemorrhoid that began early Tuesday morning after playing basketball on Monday night. Initially, he felt discomfort at 4 AM and noticed a significant protrusion that did not retract as usual. He has a history of small hemorrhoids that typically resolve on his own after bowel movements, but this episode was more severe.  On the day of the visit, he experienced a described rupture of the hemorrhoid following a bowel movement, resulting in significant bleeding. He describes the hemorrhoid as initially being the size of a grape, which reduced slightly in size after the rupture. He has been managing the condition with Epsom salt baths, ice application, and over-the-counter ointment (Top Cares brand, similar to Preparation H).  He denies any constipation prior to the incident and speculates that dehydration or physical strain from playing basketball and using a crunch machine might have contributed to the hemorrhoid's development. No yellow discharge, fever, chills, or sweats.  His blood pressure was noted to be elevated at 140/98 during the visit, although he reports a reading of 132/85 earlier in the day. He has a history of paroxysmal atrial fibrillation and is currently taking Toprol  XL 25 mg. He has lost weight recently, which has improved his blood pressure control.  He has not had any surgical procedures for hemorrhoids in the past and has not used suppositories for treatment. He manages pain and inflammation with over-the-counter treatments and has not used tramadol  before.     Review of Systems  Constitutional:  Negative for chills, fatigue and fever.  Respiratory:  Negative for cough, chest tightness, shortness of breath and wheezing.    Cardiovascular:  Negative for chest pain and palpitations.  Gastrointestinal:  Positive for rectal pain. Negative for abdominal pain, diarrhea and vomiting.       Some blood after he felt bulge and felt burst sensation having bm.  Genitourinary:  Negative for dysuria, flank pain, frequency and hematuria.  Musculoskeletal:  Negative for back pain and joint swelling.  Skin:  Negative for rash.  Neurological:  Negative for dizziness, syncope, weakness and numbness.  Psychiatric/Behavioral:  Negative for behavioral problems, dysphoric mood and suicidal ideas. The patient is not nervous/anxious.     Past Medical History:  Diagnosis Date   GERD (gastroesophageal reflux disease)    with certain foods   Hypertension    on meds   PAF (paroxysmal atrial fibrillation) ([facility]) 01/19/2016   one time incident   Panic attacks    situational   Seasonal allergies      Social History   Socioeconomic History   Marital status: Married    Spouse name: Not on file   Number of children: Not on file   Years of education: Not on file   Highest education level: Not on file  Occupational History   Occupation: works Magazine features editor  Tobacco Use   Smoking status: Never   Smokeless tobacco: Current    Types: Designer, multimedia Use   Vaping status: Never Used  Substance and Sexual Activity   Alcohol use: Not Currently    Alcohol/week: 15.0 standard drinks of alcohol    Types: 15 Standard drinks or equivalent per week    Comment: occasionally 1-2 drinks at night after work   Drug  use: Not Currently    Types: Marijuana    Comment: 2-3 times a week occasionally(he states actually very rare)   Sexual activity: Yes  Other Topics Concern   Not on file  Social History Narrative   Not on file   Social Drivers of Health   Financial Resource Strain: Not on file  Food Insecurity: Not on file  Transportation Needs: Not on file  Physical Activity: Not on file  Stress: Not on file  Social Connections: Not  on file  Intimate Partner Violence: Not on file    Past Surgical History:  Procedure Laterality Date   NO PAST SURGERIES      Family History  Problem Relation Age of Onset   Colitis Mother    Hypertension Mother    Crohn's disease Maternal Uncle    Crohn's disease Other    Colon polyps Neg Hx    Colon cancer Neg Hx    Esophageal cancer Neg Hx    Rectal cancer Neg Hx    Stomach cancer Neg Hx     No Known Allergies  Current Outpatient Medications on File Prior to Visit  Medication Sig Dispense Refill   metoprolol  succinate (TOPROL -XL) 25 MG 24 hr tablet Take 1 tablet (25 mg total) by mouth daily. 90 tablet 3   Omega-3 Fatty Acids (FISH OIL PO) Take 1 capsule by mouth every other day.     No current facility-administered medications on file prior to visit.    BP (!) 140/90   Pulse 78   Temp 97.7 F (36.5 C) (Oral)   Resp 16   Ht 6' 1 (1.854 m)   Wt 192 lb 6.4 oz (87.3 kg)   SpO2 98%   BMI 25.38 kg/m          Objective:   Physical Exam  General- No acute distress. Pleasant patient. Neck- Full range of motion, no jvd Lungs- Clear, even and unlabored. Heart- regular rate and rhythm. Neurologic- CNII- XII grossly intact.  Abdomen- soft, non tender, non distended, +BS. No rebound our guarding.  Rectal 1 cm sized apparent thrombosing hemorrhoid. Tender to palpation.      Assessment & Plan:   Patient Instructions  Thrombosed external hemorrhoid Acute thrombosed external hemorrhoid, 1 cm, with history of rupture and bleeding, causing significant discomfort. - Prescribed tramadol  1 tablet every 6 hours as needed for pain. Rx advisement given. - Advised sitz baths twice daily. - Prescribed proctofoam  four times daily; if not covered, use hydrocortisone  suppository. - Referred to surgeon for potential in-office procedure if no improvement. - Instructed to monitor for increased pain or size, and go to ED if symptoms worsen. - Advised hydration and high fiber  diet to prevent constipation.  Paroxysmal atrial fibrillation (Regular rate and rhythm now) Paroxysmal atrial fibrillation managed with Toprol  XL 25 mg. No recent palpitations. Cardiologist advised no long-term anticoagulation.  Essential hypertension Blood pressure borderline elevated at 140/98 mmHg, improved at home to 132/85 mmHg with lifestyle modifications. -refill metoprolol   Follow up by call or my chart on monday. Will update you on referral to surgeon as well.    Sent 2 tramadol  rx in by accident. So canceled one thru pharmacist.

## 2024-02-23 NOTE — Telephone Encounter (Signed)
 FYI Only or Action Required?: FYI only for provider.  Patient was last seen in primary care on 06/24/2022 by Dorina Loving, PA-C.  Called Nurse Triage reporting Hemorrhoids.  Symptoms began several days ago.  Interventions attempted: OTC medications: hemorrhoid gel.  Symptoms are: gradually worsening.  Triage Disposition: See Physician Within 24 Hours  Patient/caregiver understands and will follow disposition?: Yes            Copied from CRM #[id number]. Topic: Clinical - Red Word Triage >> Feb 23, 2024  8:24 AM Adelita E wrote: [facility] that prompted transfer to Nurse Triage: Hemorrhoid, started Tuesday morning when patient woke up. Patient stated it has been bleeding on and off with the pain level fluctuating. Reason for Disposition  MODERATE-SEVERE rectal pain (i.e., interferes with school, work, or sleep)  Answer Assessment - Initial Assessment Questions 1. SYMPTOM:  What's the main symptom you're concerned about? (e.g., pain, itching, swelling, rash)     Rectal bleeding noted on toilet paper after BM , hemorrhoid 2. ONSET: When did the sx   start?     Tuesday  3. RECTAL PAIN: Do you have any pain around your rectum? How bad is the pain?  (Scale 0-10; or none, mild, moderate, severe)     Pain with walking and bending sitting. Decrease in pain since Wednesday  4. RECTAL ITCHING: Do you have any itching in this area? How bad is the itching?  (Scale 0-10; or none, mild, moderate, severe)     na 5. CONSTIPATION: Do you have constipation? If Yes, ask: How often do you have a bowel movement (BM)?  (Normal range: 3 times a day to every 3 days)  When was your last BM?       no 6. CAUSE: What do you think is causing the anus symptoms?     hemorrhoid 7. OTHER SYMPTOMS: Do you have any other symptoms?  (e.g., abdomen pain, fever, rectal bleeding, vomiting)     Grape size hemorrhoid , minimal bleeding after defecation using gel for hemorrhoid 8.  PREGNANCY: Is there any chance you are pregnant? When was your last menstrual period?     na  Protocols used: Rectal Symptoms-A-AH

## 2024-02-23 NOTE — Patient Instructions (Signed)
 Thrombosed external hemorrhoid Acute thrombosed external hemorrhoid, 1 cm, with history of rupture and bleeding, causing significant discomfort. - Prescribed tramadol  1 tablet every 6 hours as needed for pain. Rx advisement given. - Advised sitz baths twice daily. - Prescribed proctofoam  four times daily; if not covered, use hydrocortisone  suppository. - Referred to surgeon for potential in-office procedure if no improvement. - Instructed to monitor for increased pain or size, and go to ED if symptoms worsen. - Advised hydration and high fiber diet to prevent constipation.  Paroxysmal atrial fibrillation (Regular rate and rhythm now) Paroxysmal atrial fibrillation managed with Toprol  XL 25 mg. No recent palpitations. Cardiologist advised no long-term anticoagulation.  Essential hypertension Blood pressure borderline elevated at 140/98 mmHg, improved at home to 132/85 mmHg with lifestyle modifications. -refill metoprolol   Follow up by call or my chart on monday. Will update you on referral to surgeon as well.

## 2024-02-26 ENCOUNTER — Other Ambulatory Visit (HOSPITAL_COMMUNITY): Payer: Self-pay

## 2024-02-26 ENCOUNTER — Other Ambulatory Visit: Payer: Self-pay

## 2024-02-27 ENCOUNTER — Other Ambulatory Visit: Payer: Self-pay

## 2024-02-27 DIAGNOSIS — K645 Perianal venous thrombosis: Secondary | ICD-10-CM | POA: Diagnosis not present

## 2024-02-28 ENCOUNTER — Other Ambulatory Visit: Payer: Self-pay

## 2024-03-04 ENCOUNTER — Other Ambulatory Visit: Payer: Self-pay

## 2024-06-04 ENCOUNTER — Other Ambulatory Visit (HOSPITAL_COMMUNITY): Payer: Self-pay
